# Patient Record
Sex: Female | Born: 1955 | Race: White | Hispanic: No | Marital: Married | Smoking: Never smoker
Health system: Southern US, Community
[De-identification: ages and names within clinical notes are randomized; demographics above are authoritative.]

## PROBLEM LIST (undated history)

## (undated) DIAGNOSIS — Z8742 Personal history of other diseases of the female genital tract: Secondary | ICD-10-CM

## (undated) DIAGNOSIS — N644 Mastodynia: Secondary | ICD-10-CM

## (undated) DIAGNOSIS — M1990 Unspecified osteoarthritis, unspecified site: Secondary | ICD-10-CM

## (undated) DIAGNOSIS — E039 Hypothyroidism, unspecified: Secondary | ICD-10-CM

## (undated) DIAGNOSIS — K5900 Constipation, unspecified: Secondary | ICD-10-CM

## (undated) DIAGNOSIS — I773 Arterial fibromuscular dysplasia: Secondary | ICD-10-CM

## (undated) DIAGNOSIS — T4145XA Adverse effect of unspecified anesthetic, initial encounter: Secondary | ICD-10-CM

## (undated) DIAGNOSIS — I10 Essential (primary) hypertension: Secondary | ICD-10-CM

## (undated) DIAGNOSIS — A159 Respiratory tuberculosis unspecified: Secondary | ICD-10-CM

## (undated) DIAGNOSIS — IMO0001 Reserved for inherently not codable concepts without codable children: Secondary | ICD-10-CM

## (undated) DIAGNOSIS — Z8669 Personal history of other diseases of the nervous system and sense organs: Secondary | ICD-10-CM

## (undated) DIAGNOSIS — G4730 Sleep apnea, unspecified: Secondary | ICD-10-CM

## (undated) DIAGNOSIS — Z8619 Personal history of other infectious and parasitic diseases: Secondary | ICD-10-CM

## (undated) DIAGNOSIS — N926 Irregular menstruation, unspecified: Secondary | ICD-10-CM

## (undated) DIAGNOSIS — I6529 Occlusion and stenosis of unspecified carotid artery: Secondary | ICD-10-CM

## (undated) DIAGNOSIS — E559 Vitamin D deficiency, unspecified: Secondary | ICD-10-CM

## (undated) DIAGNOSIS — K219 Gastro-esophageal reflux disease without esophagitis: Secondary | ICD-10-CM

## (undated) DIAGNOSIS — T8859XA Other complications of anesthesia, initial encounter: Secondary | ICD-10-CM

## (undated) DIAGNOSIS — M8580 Other specified disorders of bone density and structure, unspecified site: Secondary | ICD-10-CM

## (undated) DIAGNOSIS — Z87440 Personal history of urinary (tract) infections: Secondary | ICD-10-CM

## (undated) DIAGNOSIS — E7800 Pure hypercholesterolemia, unspecified: Secondary | ICD-10-CM

## (undated) HISTORY — DX: Other specified disorders of bone density and structure, unspecified site: M85.80

## (undated) HISTORY — DX: Personal history of other infectious and parasitic diseases: Z86.19

## (undated) HISTORY — DX: Occlusion and stenosis of unspecified carotid artery: I65.29

## (undated) HISTORY — DX: Hypothyroidism, unspecified: E03.9

## (undated) HISTORY — DX: Mastodynia: N64.4

## (undated) HISTORY — DX: Irregular menstruation, unspecified: N92.6

## (undated) HISTORY — PX: NASAL SEPTUM SURGERY: SHX37

## (undated) HISTORY — PX: CARDIOVASCULAR STRESS TEST: SHX262

## (undated) HISTORY — PX: BACK SURGERY: SHX140

## (undated) HISTORY — DX: Arterial fibromuscular dysplasia: I77.3

## (undated) HISTORY — DX: Vitamin D deficiency, unspecified: E55.9

## (undated) HISTORY — DX: Personal history of other diseases of the nervous system and sense organs: Z86.69

## (undated) HISTORY — DX: Personal history of urinary (tract) infections: Z87.440

## (undated) HISTORY — DX: Personal history of other diseases of the female genital tract: Z87.42

---

## 1984-06-16 DIAGNOSIS — A159 Respiratory tuberculosis unspecified: Secondary | ICD-10-CM

## 1984-06-16 HISTORY — DX: Respiratory tuberculosis unspecified: A15.9

## 1998-06-12 ENCOUNTER — Other Ambulatory Visit: Admission: RE | Admit: 1998-06-12 | Discharge: 1998-06-12 | Payer: Self-pay | Admitting: *Deleted

## 1999-08-19 ENCOUNTER — Other Ambulatory Visit: Admission: RE | Admit: 1999-08-19 | Discharge: 1999-08-19 | Payer: Self-pay | Admitting: *Deleted

## 1999-10-28 ENCOUNTER — Encounter: Admission: RE | Admit: 1999-10-28 | Discharge: 1999-10-28 | Payer: Self-pay | Admitting: Obstetrics and Gynecology

## 1999-10-28 ENCOUNTER — Encounter: Payer: Self-pay | Admitting: Obstetrics and Gynecology

## 2000-06-15 ENCOUNTER — Other Ambulatory Visit: Admission: RE | Admit: 2000-06-15 | Discharge: 2000-06-15 | Payer: Self-pay | Admitting: Obstetrics and Gynecology

## 2000-06-17 ENCOUNTER — Encounter (INDEPENDENT_AMBULATORY_CARE_PROVIDER_SITE_OTHER): Payer: Self-pay

## 2000-09-23 ENCOUNTER — Encounter: Admission: RE | Admit: 2000-09-23 | Discharge: 2000-09-23 | Payer: Self-pay | Admitting: Obstetrics and Gynecology

## 2000-09-23 ENCOUNTER — Encounter: Payer: Self-pay | Admitting: Obstetrics and Gynecology

## 2000-10-07 ENCOUNTER — Other Ambulatory Visit: Admission: RE | Admit: 2000-10-07 | Discharge: 2000-10-07 | Payer: Self-pay | Admitting: Obstetrics and Gynecology

## 2001-10-27 ENCOUNTER — Other Ambulatory Visit: Admission: RE | Admit: 2001-10-27 | Discharge: 2001-10-27 | Payer: Self-pay | Admitting: Obstetrics and Gynecology

## 2002-04-28 ENCOUNTER — Encounter: Payer: Self-pay | Admitting: Emergency Medicine

## 2002-04-28 ENCOUNTER — Encounter: Admission: RE | Admit: 2002-04-28 | Discharge: 2002-04-28 | Payer: Self-pay | Admitting: Emergency Medicine

## 2002-06-13 ENCOUNTER — Encounter: Payer: Self-pay | Admitting: Otolaryngology

## 2002-06-13 ENCOUNTER — Encounter: Admission: RE | Admit: 2002-06-13 | Discharge: 2002-06-13 | Payer: Self-pay | Admitting: Otolaryngology

## 2002-07-18 ENCOUNTER — Ambulatory Visit (HOSPITAL_COMMUNITY): Admission: RE | Admit: 2002-07-18 | Discharge: 2002-07-18 | Payer: Self-pay | Admitting: Otolaryngology

## 2002-07-18 ENCOUNTER — Encounter: Payer: Self-pay | Admitting: Otolaryngology

## 2002-12-20 ENCOUNTER — Other Ambulatory Visit: Admission: RE | Admit: 2002-12-20 | Discharge: 2002-12-20 | Payer: Self-pay | Admitting: Obstetrics and Gynecology

## 2003-01-10 ENCOUNTER — Ambulatory Visit (HOSPITAL_COMMUNITY): Admission: RE | Admit: 2003-01-10 | Discharge: 2003-01-10 | Payer: Self-pay | Admitting: *Deleted

## 2003-01-17 ENCOUNTER — Ambulatory Visit (HOSPITAL_COMMUNITY): Admission: RE | Admit: 2003-01-17 | Discharge: 2003-01-17 | Payer: Self-pay | Admitting: *Deleted

## 2003-01-17 ENCOUNTER — Encounter: Payer: Self-pay | Admitting: *Deleted

## 2003-05-16 ENCOUNTER — Encounter: Admission: RE | Admit: 2003-05-16 | Discharge: 2003-05-16 | Payer: Self-pay | Admitting: Obstetrics and Gynecology

## 2004-01-25 ENCOUNTER — Other Ambulatory Visit: Admission: RE | Admit: 2004-01-25 | Discharge: 2004-01-25 | Payer: Self-pay | Admitting: Obstetrics and Gynecology

## 2004-01-30 ENCOUNTER — Ambulatory Visit (HOSPITAL_COMMUNITY): Admission: RE | Admit: 2004-01-30 | Discharge: 2004-01-30 | Payer: Self-pay | Admitting: *Deleted

## 2004-05-16 ENCOUNTER — Ambulatory Visit (HOSPITAL_COMMUNITY): Admission: RE | Admit: 2004-05-16 | Discharge: 2004-05-16 | Payer: Self-pay | Admitting: Obstetrics and Gynecology

## 2005-04-07 ENCOUNTER — Other Ambulatory Visit: Admission: RE | Admit: 2005-04-07 | Discharge: 2005-04-07 | Payer: Self-pay | Admitting: Obstetrics and Gynecology

## 2005-09-02 ENCOUNTER — Ambulatory Visit (HOSPITAL_COMMUNITY): Admission: RE | Admit: 2005-09-02 | Discharge: 2005-09-02 | Payer: Self-pay | Admitting: Obstetrics and Gynecology

## 2006-04-14 ENCOUNTER — Other Ambulatory Visit: Admission: RE | Admit: 2006-04-14 | Discharge: 2006-04-14 | Payer: Self-pay | Admitting: Obstetrics and Gynecology

## 2006-09-17 ENCOUNTER — Ambulatory Visit (HOSPITAL_COMMUNITY): Admission: RE | Admit: 2006-09-17 | Discharge: 2006-09-17 | Payer: Self-pay | Admitting: Emergency Medicine

## 2007-09-07 ENCOUNTER — Encounter: Admission: RE | Admit: 2007-09-07 | Discharge: 2007-09-07 | Payer: Self-pay | Admitting: Obstetrics and Gynecology

## 2008-08-28 ENCOUNTER — Encounter: Admission: RE | Admit: 2008-08-28 | Discharge: 2008-08-28 | Payer: Self-pay | Admitting: Obstetrics and Gynecology

## 2009-08-30 ENCOUNTER — Encounter: Admission: RE | Admit: 2009-08-30 | Discharge: 2009-08-30 | Payer: Self-pay | Admitting: Obstetrics and Gynecology

## 2009-10-12 ENCOUNTER — Ambulatory Visit: Payer: Self-pay | Admitting: Vascular Surgery

## 2010-07-06 ENCOUNTER — Encounter: Payer: Self-pay | Admitting: Obstetrics and Gynecology

## 2010-07-07 ENCOUNTER — Encounter: Payer: Self-pay | Admitting: *Deleted

## 2010-10-08 ENCOUNTER — Other Ambulatory Visit (INDEPENDENT_AMBULATORY_CARE_PROVIDER_SITE_OTHER): Payer: PRIVATE HEALTH INSURANCE

## 2010-10-08 DIAGNOSIS — I6529 Occlusion and stenosis of unspecified carotid artery: Secondary | ICD-10-CM

## 2010-10-10 NOTE — Procedures (Unsigned)
CAROTID DUPLEX EXAM  INDICATION:  Carotid artery disease.  HISTORY: Diabetes:  No. Cardiac:  No. Hypertension:  Yes. Smoking:  No. Previous Surgery:  No. CV History:  No. Amaurosis Fugax No, Paresthesias No, Hemiparesis No.                                      RIGHT             LEFT Brachial systolic pressure:         132               127 Brachial Doppler waveforms:         Normal            Normal Vertebral direction of flow:        Antegrade         Antegrade DUPLEX VELOCITIES (cm/sec) CCA peak systolic                   64                71 ECA peak systolic                   62                39 ICA peak systolic                   M = 48, D = 120   M = 61, D = 116 ICA end diastolic                   M = 28, D = 60    M = 27, D = 50 PLAQUE MORPHOLOGY: PLAQUE AMOUNT:                      None              None PLAQUE LOCATION:  IMPRESSION: 1. Bilaterally elevated velocities are noted in the distal internal     carotid arteries. 2. This is most likely due to vessel tortuosity. 3. No plaque visualized in either internal carotid artery. 4. Antegrade vertebral arteries bilaterally.  ___________________________________________ Larina Earthly, M.D.  EM/MEDQ  D:  10/08/2010  T:  10/08/2010  Job:  [id number]

## 2010-10-16 ENCOUNTER — Other Ambulatory Visit: Payer: Self-pay

## 2010-10-29 NOTE — Assessment & Plan Note (Signed)
OFFICE VISIT   Judith Mcdonald, Judith Mcdonald  DOB:  [birth year]                                       10/12/2009  UKGUR#:[id number]   Patient presents today for followup of her known carotid and vertebral  fibromuscular dysplasia.  She was initially evaluated by Dr. Liliane Bade  in 2004 when she had studies due to some pulsatile tinnitus.  She has  had multiple MRIs since that time.  Also no progression of her disease.  That evaluation was in 2008.  She denies any focal neurologic deficits,  specifically amaurosis fugax, transient ischemic attack, or stroke.  She  occasionally does have some pounding sensation, but this is uncommon.  She has mild hypertension controlled with hydrochlorothiazide.   PAST MEDICAL HISTORY:  Otherwise unremarkable.  She is not a diabetic.  No cardiac disease.   FAMILY HISTORY:  Complete unremarkable.   SOCIAL HISTORY:  She is married with 2 children.  She works in  occupational therapy.  She does not smoke or drink alcohol.   REVIEW OF SYSTEMS:  No weight loss or weight gain.  Weight is 150  pounds.  She is 5 feet 5 inches tall.  Does have a history of esophageal  reflux, joint pain.  Review of systems is documented in her chart and  otherwise unremarkable.   PHYSICAL EXAMINATION:  A well-developed, well-nourished white female  appearing stated age.  Blood pressure is 147/91, right arm, 130/80, left  arm.  Heart rate is 55.  Temperature is 97.9.  She is in no acute  stress.  HEENT is normal.  Carotid arteries without bruits bilaterally.  Chest is clear bilaterally.  She has 2+ radial pulses bilaterally.   She underwent a carotid duplex in our office today and this reveals  moderate 49% stenosis bilaterally.  On the basis of velocities, she does  not have any plaque visualized.   I discussed this at length with patient.  I explained the symptoms of  carotid disease, which she should alert [place] to, otherwise we plan to see  her again  yearly for carotid duplex follow-up.     Larina Earthly, M.D.  Electronically Signed   TFE/MEDQ  D:  10/12/2009  T:  10/15/2009  Job:  [id number]   cc:   Kari Baars, M.D.

## 2010-10-29 NOTE — Procedures (Signed)
CAROTID DUPLEX EXAM   INDICATION:  Followup FMD.   HISTORY:  Diabetes:  No.  Cardiac:  No.  Hypertension:  Yes.  Smoking:  No.  Previous Surgery:  No.  CV History:  No.  Amaurosis Fugax No, Paresthesias No, Hemiparesis No                                       RIGHT             LEFT  Brachial systolic pressure:         130               120  Brachial Doppler waveforms:         Biphasic          Biphasic  Vertebral direction of flow:        Antegrade         Antegrade  DUPLEX VELOCITIES (cm/sec)  CCA peak systolic                   54                60  ECA peak systolic                   78                68  ICA peak systolic                   188 (distal)      120 (mid to  distal)  ICA end diastolic                   81                48  PLAQUE MORPHOLOGY:                  None              None  PLAQUE AMOUNT:                      None              None  PLAQUE LOCATION:                    None              None   IMPRESSION:  1. 40%-59% stenosis without plaque noted in bilateral mid to distal      internal carotid arteries, questionable FMD (fibromuscular      dysplasia).  2. Antegrade bilateral vertebral arteries.   ___________________________________________  Larina Earthly, M.D.   MG/MEDQ  D:  10/12/2009  T:  10/12/2009  Job:  [id number]

## 2010-11-01 NOTE — Assessment & Plan Note (Signed)
OFFICE VISIT   Judith Mcdonald, Judith Mcdonald  DOB:  1956/05/29                                       02/04/2007  ZOXWR#:[id number]   The patient underwent followup MRI angiography of the neck for bilateral  carotid fibromuscular dysplasia.  This was carried out at [facility].  Limited examination, there was no gross interval change in comparison to  previous study 1 year earlier.   I discussed these results with the patient by phone today.  She has had  some mild hypertension.  Apparently did have a renal ultrasound  recently.  She is going to obtain the results of this and have them sent  to my office.   Balinda Quails, M.D.  Electronically Signed   PGH/MEDQ  D:  02/04/2007  T:  02/06/2007  Job:  [id number]

## 2011-03-06 ENCOUNTER — Other Ambulatory Visit: Payer: Self-pay | Admitting: Obstetrics and Gynecology

## 2011-03-06 DIAGNOSIS — Z1231 Encounter for screening mammogram for malignant neoplasm of breast: Secondary | ICD-10-CM

## 2011-03-25 ENCOUNTER — Ambulatory Visit
Admission: RE | Admit: 2011-03-25 | Discharge: 2011-03-25 | Disposition: A | Discharge status: Home / Self Care | Payer: PRIVATE HEALTH INSURANCE | Source: Ambulatory Visit | Attending: Obstetrics and Gynecology | Admitting: Obstetrics and Gynecology

## 2011-03-25 DIAGNOSIS — Z1231 Encounter for screening mammogram for malignant neoplasm of breast: Secondary | ICD-10-CM

## 2011-06-17 DIAGNOSIS — IMO0001 Reserved for inherently not codable concepts without codable children: Secondary | ICD-10-CM

## 2011-06-17 HISTORY — DX: Reserved for inherently not codable concepts without codable children: IMO0001

## 2011-06-17 HISTORY — PX: CARDIAC CATHETERIZATION: SHX172

## 2011-09-23 ENCOUNTER — Ambulatory Visit: Payer: Self-pay | Admitting: Obstetrics and Gynecology

## 2011-09-25 ENCOUNTER — Encounter (HOSPITAL_COMMUNITY): Payer: Self-pay | Admitting: Nurse Practitioner

## 2011-09-25 ENCOUNTER — Emergency Department (HOSPITAL_COMMUNITY)
Admission: EM | Admit: 2011-09-25 | Discharge: 2011-09-25 | Disposition: A | Discharge status: Home / Self Care | Payer: PRIVATE HEALTH INSURANCE | Attending: Emergency Medicine | Admitting: Emergency Medicine

## 2011-09-25 ENCOUNTER — Emergency Department (HOSPITAL_COMMUNITY): Payer: PRIVATE HEALTH INSURANCE

## 2011-09-25 DIAGNOSIS — I10 Essential (primary) hypertension: Secondary | ICD-10-CM | POA: Insufficient documentation

## 2011-09-25 DIAGNOSIS — E785 Hyperlipidemia, unspecified: Secondary | ICD-10-CM | POA: Insufficient documentation

## 2011-09-25 DIAGNOSIS — R6884 Jaw pain: Secondary | ICD-10-CM | POA: Insufficient documentation

## 2011-09-25 DIAGNOSIS — Z7982 Long term (current) use of aspirin: Secondary | ICD-10-CM | POA: Insufficient documentation

## 2011-09-25 DIAGNOSIS — R079 Chest pain, unspecified: Secondary | ICD-10-CM | POA: Insufficient documentation

## 2011-09-25 HISTORY — DX: Essential (primary) hypertension: I10

## 2011-09-25 HISTORY — DX: Pure hypercholesterolemia, unspecified: E78.00

## 2011-09-25 LAB — COMPREHENSIVE METABOLIC PANEL
ALT: 27 U/L (ref 0–35)
AST: 24 U/L (ref 0–37)
CO2: 28 mEq/L (ref 19–32)
Chloride: 100 mEq/L (ref 96–112)
Creatinine, Ser: 0.8 mg/dL (ref 0.50–1.10)
GFR calc non Af Amer: 80 mL/min — ABNORMAL LOW (ref >90)
Sodium: 137 mEq/L (ref 135–145)
Total Bilirubin: 0.3 mg/dL (ref 0.3–1.2)

## 2011-09-25 LAB — CBC
MCV: 89.6 fL (ref 78.0–100.0)
Platelets: 276 10*3/uL (ref 150–400)
RBC: 4.63 MIL/uL (ref 3.87–5.11)
WBC: 9.7 10*3/uL (ref 4.0–10.5)

## 2011-09-25 LAB — URINALYSIS, ROUTINE W REFLEX MICROSCOPIC
Bilirubin Urine: NEGATIVE
Ketones, ur: NEGATIVE mg/dL
Protein, ur: NEGATIVE mg/dL
Urobilinogen, UA: 0.2 mg/dL (ref 0.0–1.0)

## 2011-09-25 LAB — URINE MICROSCOPIC-ADD ON

## 2011-09-25 LAB — POCT I-STAT TROPONIN I: Troponin i, poc: 0 ng/mL (ref 0.00–0.08)

## 2011-09-25 MED ORDER — NITROGLYCERIN 0.4 MG SL SUBL
0.4000 mg | SUBLINGUAL_TABLET | SUBLINGUAL | Status: DC | PRN
  Filled 2011-09-25: qty 25

## 2011-09-25 MED ORDER — GI COCKTAIL ~~LOC~~
30.0000 mL | Freq: Once | ORAL | Status: AC
  Administered 2011-09-25: 30 mL via ORAL
  Filled 2011-09-25: qty 30

## 2011-09-25 MED ORDER — SUCRALFATE 1 G PO TABS
1.0000 g | ORAL_TABLET | ORAL | Status: DC
  Filled 2011-09-25: qty 1

## 2011-09-25 MED ORDER — ASPIRIN 81 MG PO CHEW
324.0000 mg | CHEWABLE_TABLET | Freq: Once | ORAL | Status: AC
  Administered 2011-09-25: 162 mg via ORAL
  Filled 2011-09-25: qty 2

## 2011-09-25 MED ORDER — SUCRALFATE 1 G PO TABS: 1.0000 g | ORAL_TABLET | Freq: Four times a day (QID) | ORAL | Status: DC

## 2011-09-25 NOTE — ED Notes (Signed)
Patient states past 10 days under stress in laws are in town.  History of back fracture and pain between shoulder blades.  Today developed pain in her neck radiating to left and right chest then back to her neck. 4/10 diffuse. Airway intact bilateral equal chest rise and fall.  Patient resting comfortably on stretcher reading a book.

## 2011-09-25 NOTE — ED Provider Notes (Signed)
History     CSN: [id number]  Arrival date & time 09/25/11  1017   First MD Initiated Contact with Patient 09/25/11 1027      Chief Complaint  Patient presents with  . Chest Pain    (Consider location/radiation/quality/duration/timing/severity/associated sxs/prior treatment) HPI Comments: Patient with a history of hypertension and hyperlipidemia presents emergency department with a chief complaint of jaw and chest pain.  Symptom onset was at 9AM this morning location of pain originated in the jaw and radiates down to chest and across chest bilaterally and 2 the pack.  Pain is described as bowl and diffuse and rated at a 4/10 in severity. CP is reproducible.  Patient denies having a cardiac history as well as associated symptoms including shortness of breath, diaphoresis, PND, orthopnea, leg swelling, claudication, cough, fevers, night sweats, chills, abdominal pain, nausea, vomiting, change in bowel movements.  Patient states she had a yearly physical at Dr. Alver Fisher office yesterday and was found to be hypertensive.  Patient was advised at that time to double her blood pressure medication which she has.  Patient states she had a stress test by her cardiologist Dr. Arlyn Leak 3 years ago with normal results.  No significant family history including early MI, early sudden death, or aneurysm.  The history is provided by the patient.    Past Medical History  Diagnosis Date  . H/O typhus     at age 77 yrs  . Hx: UTI (urinary tract infection)   . Hx of menorrhagia   . Irregular menses   . Hypothyroidism   . Hx of migraines     with menses  . Hx of amenorrhea   . History of recurrent UTIs   . Mastodynia   . Hypercholesteremia   . Hypertension     Past Surgical History  Procedure Date  . Cesarean section     2x in 1984 & 1986    Family History  Problem Relation Age of Onset  . Hypertension Father   . Cancer Father   . Heart disease Maternal Grandmother     History  Substance Use  Topics  . Smoking status: Never Smoker   . Smokeless tobacco: Not on file  . Alcohol Use: Yes     social    OB History    Grav Para Term Preterm Abortions TAB SAB Ect Mult Living                  Review of Systems  Constitutional: Negative for fever, chills, diaphoresis and appetite change.  HENT: Negative for congestion, neck pain and neck stiffness.   Eyes: Negative for visual disturbance.  Respiratory: Negative for cough, chest tightness, shortness of breath and wheezing.   Cardiovascular: Positive for chest pain. Negative for leg swelling.  Gastrointestinal: Negative for abdominal pain.  Genitourinary: Negative for dysuria, urgency and frequency.  Neurological: Negative for dizziness, syncope, weakness, light-headedness, numbness and headaches.  Psychiatric/Behavioral: Negative for confusion.  All other systems reviewed and are negative.    Allergies  Review of patient's allergies indicates no known allergies.  Home Medications   Current Outpatient Rx  Name Route Sig Dispense Refill  . ASPIRIN EC 81 MG PO TBEC Oral Take 81 mg by mouth daily.    Marland Kitchen VITAMIN D 2000 UNITS PO CAPS Oral Take 1 capsule by mouth daily.    Marland Kitchen FLUTICASONE PROPIONATE 50 MCG/ACT NA SUSP Nasal Place 1-2 sprays into the nose daily.    Marland Kitchen HYDROCHLOROTHIAZIDE 25 MG PO TABS  Oral Take 25 mg by mouth daily.    Marland Kitchen LEVOTHYROXINE SODIUM 75 MCG PO TABS Oral Take 75 mcg by mouth daily.    Marland Kitchen LISINOPRIL 10 MG PO TABS Oral Take 10 mg by mouth daily.    Marland Kitchen RABEPRAZOLE SODIUM 20 MG PO TBEC Oral Take 20 mg by mouth daily.    Marland Kitchen ROSUVASTATIN CALCIUM 10 MG PO TABS Oral Take 10 mg by mouth daily.      BP 153/107  Pulse 78  Temp(Src) 97.9 F (36.6 C) (Oral)  Resp 18  Ht 5\' 5"  (1.651 m)  Wt 150 lb (68.04 kg)  BMI 24.96 kg/m2  SpO2 96%  Physical Exam  Nursing note and vitals reviewed. Constitutional: She appears well-developed and well-nourished. No distress.  HENT:  Head: Normocephalic and atraumatic.        Normal jaw ROM, no ttp of TMJ   Eyes: Conjunctivae and EOM are normal. Pupils are equal, round, and reactive to light.  Neck: Normal range of motion. Neck supple. Normal carotid pulses and no JVD present. Carotid bruit is not present. No rigidity. Normal range of motion present.  Cardiovascular: Normal rate, regular rhythm, S1 normal, S2 normal, normal heart sounds, intact distal pulses and normal pulses.  Exam reveals no gallop and no friction rub.   No murmur heard.      No pitting edema bilaterally, RRR, no aberrant sounds on auscultations, distal pulses intact, no carotid bruit or JVD.   Pulmonary/Chest: Effort normal and breath sounds normal. No accessory muscle usage or stridor. No respiratory distress. She exhibits no tenderness and no bony tenderness.  Abdominal: Bowel sounds are normal.       Soft non tender. Non pulsatile aorta.   Skin: Skin is warm, dry and intact. No rash noted. She is not diaphoretic. No cyanosis. Nails show no clubbing.    ED Course  Procedures (including critical care time)   Labs Reviewed  CBC  COMPREHENSIVE METABOLIC PANEL  URINALYSIS, ROUTINE W REFLEX MICROSCOPIC   No results found.   No diagnosis found.    Date: 09/25/2011  Rate:  86  Rhythm: normal sinus rhythm  QRS Axis: normal  Intervals: normal  ST/T Wave abnormalities: normal  Conduction Disutrbances: none  Narrative Interpretation:   Old EKG Reviewed: No significant changes noted      MDM  Chest pain, hypertension  Pt with increased stress presents with CP and HTN. Low concern for ACS. Move to CDU for 2nd round troponin. If WNL, pldc is to dc w follow up w Dr. Arlyn Leak early next week. Pt is stable without active CP prior to trasfer. Discussed case with nurse and PA in CDU.           Jaci Carrel, [place] 09/27/11 1620

## 2011-09-25 NOTE — ED Notes (Signed)
EDP at bedside speaking with patient and husband.  States not to give nitro at this time.

## 2011-09-25 NOTE — ED Provider Notes (Signed)
Pt in CDU, waiting on 3hr troponin.  She presented to ED w/ atypical chest pain that feels similar to her acid reflux and seemed to improve w/ GI cocktail.  Has been evaluated by Dr. Donnie Aho in the past and reports an unremarkable stress echo 2-4 years ago.  Has had mild chest tightness at rest here in ED.  On repeat exam, pt in NAD, VSS, heart w/ RRR and lungs CTA.  Will try a dose of carafate and d/c pt home w/ same if repeat troponin neg.  Pt takes daily aciphex currently and I recommended she continue this medication.    Judith Mcdonald [place], [place] 09/25/11 2016

## 2011-09-25 NOTE — ED Notes (Signed)
Reports she has been very stressed out over past 10 days. Reports she went for a routine physical yesterday and BP was elevated. Woke this am with chest pain radiating into jaw and back. Took tums with no relief. Pain has persisted since onset. Denies cardiac history

## 2011-09-25 NOTE — ED Notes (Signed)
Pt states this is probably r/t on going stress in family. States in-laws are visiting this week.

## 2011-09-25 NOTE — ED Notes (Signed)
Diet tray ordered 

## 2011-09-25 NOTE — Discharge Instructions (Signed)
Take carafate up to four times a day as needed for acid reflux pain.  Continue your aciphex.  Follow up with Dr. Donnie Aho sometime within the next week.  You should return to the ER if your symptoms worsen or you have any other concerns. Chest Pain (Nonspecific) It is often hard to give a specific diagnosis for the cause of chest pain. There is always a chance that your pain could be related to something serious, such as a heart attack or a blood clot in the lungs. You need to follow up with your caregiver for further evaluation. CAUSES   Heartburn.   Pneumonia or bronchitis.   Anxiety or stress.   Inflammation around your heart (pericarditis) or lung (pleuritis or pleurisy).   A blood clot in the lung.   A collapsed lung (pneumothorax). It can develop suddenly on its own (spontaneous pneumothorax) or from injury (trauma) to the chest.   Shingles infection (herpes zoster virus).  The chest wall is composed of bones, muscles, and cartilage. Any of these can be the source of the pain.  The bones can be bruised by injury.   The muscles or cartilage can be strained by coughing or overwork.   The cartilage can be affected by inflammation and become sore (costochondritis).  DIAGNOSIS  Lab tests or other studies, such as X-rays, electrocardiography, stress testing, or cardiac imaging, may be needed to find the cause of your pain.  TREATMENT   Treatment depends on what may be causing your chest pain. Treatment may include:   Acid blockers for heartburn.   Anti-inflammatory medicine.   Pain medicine for inflammatory conditions.   Antibiotics if an infection is present.   You may be advised to change lifestyle habits. This includes stopping smoking and avoiding alcohol, caffeine, and chocolate.   You may be advised to keep your head raised (elevated) when sleeping. This reduces the chance of acid going backward from your stomach into your esophagus.   Most of the time, nonspecific chest  pain will improve within 2 to 3 days with rest and mild pain medicine.  HOME CARE INSTRUCTIONS   If antibiotics were prescribed, take your antibiotics as directed. Finish them even if you start to feel better.   For the next few days, avoid physical activities that bring on chest pain. Continue physical activities as directed.   Do not smoke.   Avoid drinking alcohol.   Only take over-the-counter or prescription medicine for pain, discomfort, or fever as directed by your caregiver.   Follow your caregiver's suggestions for further testing if your chest pain does not go away.   Keep any follow-up appointments you made. If you do not go to an appointment, you could develop lasting (chronic) problems with pain. If there is any problem keeping an appointment, you must call to reschedule.  SEEK MEDICAL CARE IF:   You think you are having problems from the medicine you are taking. Read your medicine instructions carefully.   Your chest pain does not go away, even after treatment.   You develop a rash with blisters on your chest.  SEEK IMMEDIATE MEDICAL CARE IF:   You have increased chest pain or pain that spreads to your arm, neck, jaw, back, or abdomen.   You develop shortness of breath, an increasing cough, or you are coughing up blood.   You have severe back or abdominal pain, feel nauseous, or vomit.   You develop severe weakness, fainting, or chills.   You have a  fever.  THIS IS AN EMERGENCY. Do not wait to see if the pain will go away. Get medical help at once. Call your local emergency services (911 in [place] Do not drive yourself to the hospital. MAKE SURE YOU:   Understand these instructions.   Will watch your condition.   Will get help right away if you are not doing well or get worse.  Document Released: 03/12/2005 Document Revised: 05/22/2011 Document Reviewed: 01/06/2008 [facility] Patient Information 2012 [place].

## 2011-09-26 NOTE — ED Provider Notes (Signed)
Medical screening examination/treatment/procedure(s) were conducted as a shared visit with non-physician practitioner(s) and myself.  I personally evaluated the patient during the encounter   Glynn Octave, MD 09/26/11 [phone]

## 2011-09-28 NOTE — ED Provider Notes (Signed)
Medical screening examination/treatment/procedure(s) were conducted as a shared visit with non-physician practitioner(s) and myself.  I personally evaluated the patient during the encounter  Atypical chest pain x 3hours, reproducible. EKG normal sinus.   Glynn Octave, MD 09/28/11 [phone]

## 2011-10-15 ENCOUNTER — Other Ambulatory Visit: Payer: Self-pay | Admitting: Obstetrics and Gynecology

## 2011-10-15 NOTE — Telephone Encounter (Signed)
Routed to nurse pool 

## 2011-10-15 NOTE — Telephone Encounter (Signed)
[facility] PT 

## 2011-10-16 ENCOUNTER — Other Ambulatory Visit: Payer: PRIVATE HEALTH INSURANCE

## 2011-10-16 ENCOUNTER — Ambulatory Visit: Payer: PRIVATE HEALTH INSURANCE | Admitting: Neurosurgery

## 2011-10-16 NOTE — Telephone Encounter (Signed)
Lm on vm to cb °

## 2011-10-20 ENCOUNTER — Ambulatory Visit: Payer: Self-pay | Admitting: Obstetrics and Gynecology

## 2011-10-21 ENCOUNTER — Other Ambulatory Visit: Payer: Self-pay

## 2011-10-21 MED ORDER — ESTRADIOL 10 MCG VA TABS: 1.0000 | ORAL_TABLET | VAGINAL | Status: DC

## 2011-10-21 NOTE — Telephone Encounter (Signed)
Written rx received from pharm for Vagifem. AEX sched 11-12-11 with [facility]. Rx rf faxed to [facility] [phone] til AEX.

## 2011-11-05 ENCOUNTER — Encounter: Payer: Self-pay | Admitting: Neurosurgery

## 2011-11-06 ENCOUNTER — Ambulatory Visit (INDEPENDENT_AMBULATORY_CARE_PROVIDER_SITE_OTHER): Payer: PRIVATE HEALTH INSURANCE | Admitting: *Deleted

## 2011-11-06 ENCOUNTER — Encounter: Payer: Self-pay | Admitting: Neurosurgery

## 2011-11-06 ENCOUNTER — Ambulatory Visit (INDEPENDENT_AMBULATORY_CARE_PROVIDER_SITE_OTHER): Payer: PRIVATE HEALTH INSURANCE | Admitting: Neurosurgery

## 2011-11-06 VITALS — BP 107/74 | HR 58 | Resp 12 | Ht 65.0 in | Wt 144.7 lb

## 2011-11-06 DIAGNOSIS — I6529 Occlusion and stenosis of unspecified carotid artery: Secondary | ICD-10-CM

## 2011-11-06 NOTE — Progress Notes (Signed)
[facility] [place] HISTORY AND PHYSICAL   CC: Yearly carotid duplex for known vertebral fibromuscular dysplasia Referring Physician: Early  History of Present Illness: 56 year-old female patient of Dr. Arbie Cookey is followed for known vertebral fibromuscular dysplasia. The patient reports no signs or symptoms of CVA or TIA. She has no headaches and reports no new medical diagnoses or recent surgeries.  Past Medical History  Diagnosis Date  . H/O typhus     at age 61 yrs  . Hx: UTI (urinary tract infection)   . Hx of menorrhagia   . Irregular menses   . Hypothyroidism   . Hx of migraines     with menses  . Hx of amenorrhea   . History of recurrent UTIs   . Mastodynia   . Hypercholesteremia   . Hypertension     ROS: [x]  Positive   [ ]  Denies    General: [ ]  Weight loss, [ ]  Fever, [ ]  chills Neurologic: [ ]  Dizziness, [ ]  Blackouts, [ ]  Seizure [ ]  Stroke, [ ]  "Mini stroke", [ ]  Slurred speech, [ ]  Temporary blindness; [ ]  weakness in arms or legs, [ ]  Hoarseness Cardiac: [ ]  Chest pain/pressure, [ ]  Shortness of breath at rest [ ]  Shortness of breath with exertion, [ ]  Atrial fibrillation or irregular heartbeat Vascular: [ ]  Pain in legs with walking, [ ]  Pain in legs at rest, [ ]  Pain in legs at night,  [ ]  Non-healing ulcer, [ ]  Blood clot in vein/DVT,   Pulmonary: [ ]  Home oxygen, [ ]  Productive cough, [ ]  Coughing up blood, [ ]  Asthma,  [ ]  Wheezing Musculoskeletal:  [ ]  Arthritis, [ ]  Low back pain, [ ]  Joint pain Hematologic: [ ]  Easy Bruising, [ ]  Anemia; [ ]  Hepatitis Gastrointestinal: [ ]  Blood in stool, [ ]  Gastroesophageal Reflux/heartburn, [ ]  Trouble swallowing Urinary: [ ]  chronic Kidney disease, [ ]  on HD - [ ]  MWF or [ ]  TTHS, [ ]  Burning with urination, [ ]  Difficulty urinating Skin: [ ]  Rashes, [ ]  Wounds Psychological: [ ]  Anxiety, [ ]  Depression   Social History History  Substance Use Topics  . Smoking status: Never Smoker   .  Smokeless tobacco: Not on file  . Alcohol Use: Yes     social    Family History Family History  Problem Relation Age of Onset  . Hypertension Father   . Cancer Father   . Heart disease Maternal Grandmother     No Known Allergies  Current Outpatient Prescriptions  Medication Sig Dispense Refill  . aspirin EC 81 MG tablet Take 81 mg by mouth daily.      . Cholecalciferol (VITAMIN D) 2000 UNITS CAPS Take 1 capsule by mouth daily.      . Estradiol (VAGIFEM) 10 MCG TABS Place 1 tablet (10 mcg total) vaginally 2 (two) times a week.  24 tablet  0  . fluticasone (FLONASE) 50 MCG/ACT nasal spray Place 1-2 sprays into the nose daily.      . hydrochlorothiazide (HYDRODIURIL) 25 MG tablet Take 25 mg by mouth daily.      Marland Kitchen levothyroxine (SYNTHROID, LEVOTHROID) 75 MCG tablet Take 75 mcg by mouth daily.      Marland Kitchen lisinopril (PRINIVIL,ZESTRIL) 10 MG tablet Take 10 mg by mouth daily.      . rosuvastatin (CRESTOR) 10 MG tablet Take 10 mg by mouth daily.      . RABEprazole (ACIPHEX) 20 MG tablet Take  20 mg by mouth daily.      . sucralfate (CARAFATE) 1 G tablet Take 1 tablet (1 g total) by mouth 4 (four) times daily.  30 tablet  0    Physical Examination  Filed Vitals:   11/06/11 1127  BP: 107/74  Pulse: 58  Resp: 12    Body mass index is 24.08 kg/(m^2).  General:  WDWN in NAD Gait: Normal HEENT: WNL Eyes: Pupils equal Pulmonary: normal non-labored breathing , without Rales, rhonchi,  wheezing Cardiac: RRR, without  Murmurs, rubs or gallops; Abdomen: soft, NT, no masses Skin: no rashes, ulcers noted  Vascular Exam Pulses: 2+ radial pulses bilaterally Carotid bruits: Carotid pulses to auscultation with no bruits heard Extremities without ischemic changes, no Gangrene , no cellulitis; no open wounds;  Musculoskeletal: no muscle wasting or atrophy   Neurologic: A&O X 3; Appropriate Affect ; SENSATION: normal; MOTOR FUNCTION:  moving all extremities equally. Speech is  fluent/normal  Non-Invasive Vascular Imaging CAROTID DUPLEX 11/06/2011  Right ICA 0 - 19% stenosis Left ICA 0 - 19% stenosis   ASSESSMENT/PLAN: asymptomatic patient with known vertebral fibromuscular dysplasia, the patient will followup here in one year for repeat carotid duplex, her questions were encouraged and answered she is in agreement with this plan.  Lauree Chandler ANP   Clinic MD: Darrick Penna

## 2011-11-06 NOTE — Progress Notes (Signed)
Addended by: Sharee Pimple on: 11/06/2011 01:28 PM   Modules accepted: Orders

## 2011-11-12 ENCOUNTER — Encounter: Payer: Self-pay | Admitting: Obstetrics and Gynecology

## 2011-11-12 ENCOUNTER — Ambulatory Visit (INDEPENDENT_AMBULATORY_CARE_PROVIDER_SITE_OTHER): Payer: PRIVATE HEALTH INSURANCE | Admitting: Obstetrics and Gynecology

## 2011-11-12 ENCOUNTER — Other Ambulatory Visit: Payer: Self-pay | Admitting: Obstetrics and Gynecology

## 2011-11-12 DIAGNOSIS — N952 Postmenopausal atrophic vaginitis: Secondary | ICD-10-CM | POA: Insufficient documentation

## 2011-11-12 DIAGNOSIS — Z124 Encounter for screening for malignant neoplasm of cervix: Secondary | ICD-10-CM

## 2011-11-12 DIAGNOSIS — R319 Hematuria, unspecified: Secondary | ICD-10-CM | POA: Insufficient documentation

## 2011-11-12 DIAGNOSIS — I6529 Occlusion and stenosis of unspecified carotid artery: Secondary | ICD-10-CM

## 2011-11-12 LAB — POCT URINALYSIS DIPSTICK
Protein, UA: NEGATIVE
Spec Grav, UA: 1.01
Urobilinogen, UA: NEGATIVE

## 2011-11-12 MED ORDER — NITROFURANTOIN MACROCRYSTAL 50 MG PO CAPS: 50.0000 mg | ORAL_CAPSULE | ORAL | Status: DC | PRN

## 2011-11-12 MED ORDER — ESTRADIOL 10 MCG VA TABS: 1.0000 | ORAL_TABLET | VAGINAL | Status: DC

## 2011-11-12 NOTE — Progress Notes (Signed)
Addended by: Darien Ramus on: 11/12/2011 11:00 AM   Modules accepted: Orders

## 2011-11-12 NOTE — Patient Instructions (Signed)

## 2011-11-12 NOTE — Assessment & Plan Note (Signed)
dx'd 2003 no further sx

## 2011-11-12 NOTE — Progress Notes (Signed)
The patient is not  taking hormone replacement therapy, but uses Vagifem about once every 2 weeks for vaginal dryness The patient  is not currently taking a Calcium supplement. Post-menopausal bleeding:no  Last Pap: approximate date 08/2010 and was normal No hx of abnormal Last mammogram: approximate date 03/2011 and was normal Last DEXA scan : 08/2011 Last colonoscopy:approximate date 2005 and was normal   Urinary symptoms: hematuria, burning Normal bowel movements: Yes Reports abuse at home: No:   Subjective:    Judith Mcdonald is a 56 y.o. female, [phone], who presents for an annual exam.     History   Social History  . Marital Status: Married    Spouse Name: N/A    Number of Children: N/A  . Years of Education: N/A   Social History Main Topics  . Smoking status: Never Smoker   . Smokeless tobacco: Never Used  . Alcohol Use: Yes     social  . Drug Use: No  . Sexually Active: Yes    Birth Control/ Protection: None   Other Topics Concern  . None   Social History Narrative  . None    Menstrual cycle:   LMP: No LMP recorded. Patient is postmenopausal.        The following portions of the patient's history were reviewed and updated as appropriate: allergies, current medications, past family history, past medical history, past social history, past surgical history and problem list. The patient has been referred to urologist for re-evaluation for hematuria.  She uses prophylactic Macrodantin for intercourse.  Review of Systems Pertinent items are noted in HPI. Breast:Negative for breast lump,nipple discharge or nipple retraction Gastrointestinal: Negative for abdominal pain, change in bowel habits or rectal bleeding Urinary:as above   Objective:    BP 122/58  Ht 5' 4.5" (1.638 m)  Wt 146 lb (66.225 kg)  BMI 24.67 kg/m2    Weight:  Wt Readings from Last 1 Encounters:  11/12/11 146 lb (66.225 kg)          BMI: Body mass index is 24.67 kg/(m^2).  General  Appearance: Alert, appropriate appearance for age. No acute distress HEENT: Grossly normal Neck / Thyroid: Supple, no masses, nodes or enlargement Lungs: clear to auscultation bilaterally Back: No CVA tenderness Breast Exam: No masses or nodes.No dimpling, nipple retraction or discharge. Cardiovascular: Regular rate and rhythm. S1, S2, no murmur Gastrointestinal: Soft, non-tender, no masses or organomegaly Pelvic Exam: Vulva and vagina appear normal. Bimanual exam reveals normal uterus and adnexa. Rectovaginal: normal rectal, no masses Lymphatic Exam: Non-palpable nodes in neck, clavicular, axillary, or inguinal regions Skin: no rash or abnormalities Neurologic: Normal gait and speech, no tremor  Psychiatric: Alert and oriented, appropriate affect.   Wet Prep:not applicable Urinalysis:leukocytes C&S  Sent UPT: Not done   Assessment:    Normal gyn exam Menopause with atrophic vaginal sx relieved by prn Vagifem UTI's prophylaxis, but chronic hematuria    Plan:    mammogram pap smear with HR PV return annually or prn CONTINUE MACRODANTIN AND VAGIFEM uROLOGY F/U AS ORDERED          Brion Sossamon PMD

## 2011-11-12 NOTE — Telephone Encounter (Signed)
Pc to pharm per telephone call. Quantity #30 for Macrodantin verified. Pharm agrees.

## 2011-11-12 NOTE — Telephone Encounter (Signed)
Judith Mcdonald/vph pt/pt was seen today

## 2011-11-12 NOTE — Procedures (Unsigned)
CAROTID DUPLEX EXAM  INDICATION:  Follow up carotid disease/fibromuscular dysplasia.  HISTORY: Diabetes:  No. Cardiac:  No. Hypertension:  Yes. Smoking:  No. Previous Surgery:  No. CV History:  No. Amaurosis Fugax No, Paresthesias No, Hemiparesis No                                      RIGHT             LEFT Brachial systolic pressure:         130               122 Brachial Doppler waveforms:         Normal            Normal Vertebral direction of flow:        Antegrade         Antegrade DUPLEX VELOCITIES (cm/sec) CCA peak systolic                   64                66 ECA peak systolic                   48                56 ICA peak systolic                   96                64 ICA end diastolic                   51                30 PLAQUE MORPHOLOGY: PLAQUE AMOUNT:                      None              None PLAQUE LOCATION:  IMPRESSION: 1. Elevated velocities in the distal bilateral internal carotid     arteries which is most likely due to vessel tortuosity because no     plaque is visualized. 2. Antegrade vertebral arteries bilaterally. 3. Stable in comparison to the last exam.  ___________________________________________ Larina Earthly, M.D.  EM/MEDQ  D:  11/07/2011  T:  11/07/2011  Job:  [id number]

## 2011-11-14 LAB — URINE CULTURE

## 2011-11-16 LAB — PAP IG AND HPV HIGH-RISK: HPV DNA High Risk: NOT DETECTED

## 2012-02-27 ENCOUNTER — Other Ambulatory Visit: Payer: Self-pay | Admitting: Obstetrics and Gynecology

## 2012-02-27 DIAGNOSIS — Z1231 Encounter for screening mammogram for malignant neoplasm of breast: Secondary | ICD-10-CM

## 2012-03-05 ENCOUNTER — Other Ambulatory Visit: Payer: Self-pay | Admitting: Internal Medicine

## 2012-03-05 DIAGNOSIS — R1013 Epigastric pain: Secondary | ICD-10-CM

## 2012-03-10 ENCOUNTER — Other Ambulatory Visit: Payer: Self-pay | Admitting: Internal Medicine

## 2012-03-10 ENCOUNTER — Ambulatory Visit
Admission: RE | Admit: 2012-03-10 | Discharge: 2012-03-10 | Disposition: A | Discharge status: Home / Self Care | Payer: PRIVATE HEALTH INSURANCE | Source: Ambulatory Visit | Attending: Internal Medicine | Admitting: Internal Medicine

## 2012-03-10 DIAGNOSIS — R1013 Epigastric pain: Secondary | ICD-10-CM

## 2012-03-10 MED ORDER — IOHEXOL 350 MG/ML SOLN
80.0000 mL | Freq: Once | INTRAVENOUS | Status: AC | PRN
  Administered 2012-03-10: 80 mL via INTRAVENOUS

## 2012-03-23 ENCOUNTER — Other Ambulatory Visit: Payer: Self-pay | Admitting: Neurosurgery

## 2012-03-25 ENCOUNTER — Ambulatory Visit: Payer: PRIVATE HEALTH INSURANCE

## 2012-03-31 ENCOUNTER — Ambulatory Visit
Admission: RE | Admit: 2012-03-31 | Discharge: 2012-03-31 | Disposition: A | Discharge status: Home / Self Care | Payer: PRIVATE HEALTH INSURANCE | Source: Ambulatory Visit | Attending: Obstetrics and Gynecology | Admitting: Obstetrics and Gynecology

## 2012-03-31 DIAGNOSIS — Z1231 Encounter for screening mammogram for malignant neoplasm of breast: Secondary | ICD-10-CM

## 2012-04-21 ENCOUNTER — Encounter (HOSPITAL_COMMUNITY): Payer: Self-pay

## 2012-04-26 ENCOUNTER — Encounter (HOSPITAL_COMMUNITY): Payer: Self-pay

## 2012-04-26 ENCOUNTER — Encounter (HOSPITAL_COMMUNITY)
Admission: RE | Admit: 2012-04-26 | Discharge: 2012-04-26 | Disposition: A | Discharge status: Home / Self Care | Payer: PRIVATE HEALTH INSURANCE | Source: Ambulatory Visit | Attending: Anesthesiology | Admitting: Anesthesiology

## 2012-04-26 ENCOUNTER — Encounter (HOSPITAL_COMMUNITY)
Admission: RE | Admit: 2012-04-26 | Discharge: 2012-04-26 | Disposition: A | Discharge status: Home / Self Care | Payer: PRIVATE HEALTH INSURANCE | Source: Ambulatory Visit | Attending: Neurosurgery | Admitting: Neurosurgery

## 2012-04-26 HISTORY — DX: Gastro-esophageal reflux disease without esophagitis: K21.9

## 2012-04-26 HISTORY — DX: Reserved for inherently not codable concepts without codable children: IMO0001

## 2012-04-26 HISTORY — DX: Respiratory tuberculosis unspecified: A15.9

## 2012-04-26 LAB — CBC
Platelets: 284 10*3/uL (ref 150–400)
RDW: 12.6 % (ref 11.5–15.5)
WBC: 8.9 10*3/uL (ref 4.0–10.5)

## 2012-04-26 LAB — BASIC METABOLIC PANEL
Calcium: 9.6 mg/dL (ref 8.4–10.5)
Creatinine, Ser: 0.84 mg/dL (ref 0.50–1.10)
GFR calc Af Amer: 88 mL/min — ABNORMAL LOW (ref >90)
Sodium: 140 mEq/L (ref 135–145)

## 2012-04-26 LAB — SURGICAL PCR SCREEN
MRSA, PCR: NEGATIVE
Staphylococcus aureus: NEGATIVE

## 2012-04-26 NOTE — Pre-Procedure Instructions (Signed)
20 APRIL COLTER  04/26/2012   Your procedure is scheduled on:  Wednesday, November 20th  Report to [facility] at 0630 AM.  Call this number if you have problems the morning of surgery: [phone]   Remember:   Do not eat food or drink:After Midnight.   Take these medicines the morning of surgery with A SIP OF WATER: nexium, flonase, synthroid   Do not wear jewelry, make-up or nail polish.  Do not wear lotions, powders, or perfumes.   Do not shave 48 hours prior to surgery.  Do not bring valuables to the hospital.  Contacts, dentures or bridgework may not be worn into surgery.  Leave suitcase in the car. After surgery it may be brought to your room.  For patients admitted to the hospital, checkout time is 11:00 AM the day of discharge.   Patients discharged the day of surgery will not be allowed to drive home.   Special Instructions: Shower using CHG 2 nights before surgery and the night before surgery.  If you shower the day of surgery use CHG.  Use special wash - you have one bottle of CHG for all showers.  You should use approximately 1/3 of the bottle for each shower.   Please read over the following fact sheets that you were given: Pain Booklet, Coughing and Deep Breathing, Blood Transfusion Information, MRSA Information and Surgical Site Infection Prevention

## 2012-05-04 MED ORDER — CEFAZOLIN SODIUM-DEXTROSE 2-3 GM-% IV SOLR
2.0000 g | INTRAVENOUS | Status: DC
  Filled 2012-05-04: qty 50

## 2012-05-04 MED ORDER — DEXAMETHASONE SODIUM PHOSPHATE 10 MG/ML IJ SOLN
10.0000 mg | INTRAMUSCULAR | Status: DC
  Filled 2012-05-04: qty 1

## 2012-05-05 ENCOUNTER — Encounter (HOSPITAL_COMMUNITY): Payer: Self-pay | Admitting: *Deleted

## 2012-05-05 ENCOUNTER — Encounter (HOSPITAL_COMMUNITY): Payer: Self-pay | Admitting: Certified Registered"

## 2012-05-05 ENCOUNTER — Inpatient Hospital Stay (HOSPITAL_COMMUNITY)
Admission: RE | Admit: 2012-05-05 | Discharge: 2012-05-08 | DRG: 460 | Disposition: A | Discharge status: Home / Self Care | Payer: PRIVATE HEALTH INSURANCE | Source: Ambulatory Visit | Attending: Neurosurgery | Admitting: Neurosurgery

## 2012-05-05 ENCOUNTER — Encounter (HOSPITAL_COMMUNITY)
Admission: RE | Disposition: A | Discharge status: Home / Self Care | Payer: Self-pay | Source: Ambulatory Visit | Attending: Neurosurgery

## 2012-05-05 ENCOUNTER — Ambulatory Visit (HOSPITAL_COMMUNITY): Payer: PRIVATE HEALTH INSURANCE | Admitting: Certified Registered"

## 2012-05-05 ENCOUNTER — Ambulatory Visit (HOSPITAL_COMMUNITY): Payer: PRIVATE HEALTH INSURANCE

## 2012-05-05 DIAGNOSIS — I10 Essential (primary) hypertension: Secondary | ICD-10-CM | POA: Diagnosis present

## 2012-05-05 DIAGNOSIS — N952 Postmenopausal atrophic vaginitis: Secondary | ICD-10-CM

## 2012-05-05 DIAGNOSIS — M4310 Spondylolisthesis, site unspecified: Secondary | ICD-10-CM | POA: Diagnosis present

## 2012-05-05 DIAGNOSIS — E039 Hypothyroidism, unspecified: Secondary | ICD-10-CM | POA: Diagnosis present

## 2012-05-05 DIAGNOSIS — M51379 Other intervertebral disc degeneration, lumbosacral region without mention of lumbar back pain or lower extremity pain: Secondary | ICD-10-CM | POA: Diagnosis present

## 2012-05-05 DIAGNOSIS — M5137 Other intervertebral disc degeneration, lumbosacral region: Secondary | ICD-10-CM | POA: Diagnosis present

## 2012-05-05 DIAGNOSIS — Z7982 Long term (current) use of aspirin: Secondary | ICD-10-CM

## 2012-05-05 DIAGNOSIS — I7789 Other specified disorders of arteries and arterioles: Secondary | ICD-10-CM | POA: Diagnosis present

## 2012-05-05 DIAGNOSIS — Z79899 Other long term (current) drug therapy: Secondary | ICD-10-CM

## 2012-05-05 DIAGNOSIS — M48061 Spinal stenosis, lumbar region without neurogenic claudication: Principal | ICD-10-CM | POA: Diagnosis present

## 2012-05-05 DIAGNOSIS — E7800 Pure hypercholesterolemia, unspecified: Secondary | ICD-10-CM | POA: Diagnosis present

## 2012-05-05 DIAGNOSIS — K219 Gastro-esophageal reflux disease without esophagitis: Secondary | ICD-10-CM | POA: Diagnosis present

## 2012-05-05 DIAGNOSIS — Z01812 Encounter for preprocedural laboratory examination: Secondary | ICD-10-CM

## 2012-05-05 DIAGNOSIS — Z01818 Encounter for other preprocedural examination: Secondary | ICD-10-CM

## 2012-05-05 LAB — TYPE AND SCREEN: ABO/RH(D): O POS

## 2012-05-05 LAB — ABO/RH: ABO/RH(D): O POS

## 2012-05-05 SURGERY — POSTERIOR LUMBAR FUSION 2 LEVEL
Anesthesia: General | Site: Back | Wound class: Clean

## 2012-05-05 MED ORDER — LIDOCAINE-EPINEPHRINE 1 %-1:100000 IJ SOLN
INTRAMUSCULAR | Status: DC | PRN
  Administered 2012-05-05: 10 mL

## 2012-05-05 MED ORDER — ESTRADIOL 10 MCG VA TABS: 1.0000 | ORAL_TABLET | VAGINAL | Status: DC

## 2012-05-05 MED ORDER — LIDOCAINE HCL 4 % MT SOLN
OROMUCOSAL | Status: DC | PRN
  Administered 2012-05-05: 4 mL via TOPICAL

## 2012-05-05 MED ORDER — ACETAMINOPHEN 10 MG/ML IV SOLN
INTRAVENOUS | Status: AC
  Filled 2012-05-05: qty 100

## 2012-05-05 MED ORDER — ACETAMINOPHEN 10 MG/ML IV SOLN
INTRAVENOUS | Status: DC | PRN
  Administered 2012-05-05: 1000 mg via INTRAVENOUS

## 2012-05-05 MED ORDER — HYDROMORPHONE HCL PF 1 MG/ML IJ SOLN
0.5000 mg | INTRAMUSCULAR | Status: DC | PRN
  Administered 2012-05-05: 1 mg via INTRAVENOUS

## 2012-05-05 MED ORDER — SODIUM CHLORIDE 0.9 % IJ SOLN: 3.0000 mL | INTRAMUSCULAR | Status: DC | PRN

## 2012-05-05 MED ORDER — SODIUM CHLORIDE 0.9 % IV BOLUS (SEPSIS)
500.0000 mL | Freq: Once | INTRAVENOUS | Status: AC
  Administered 2012-05-06: 500 mL via INTRAVENOUS

## 2012-05-05 MED ORDER — DIPHENHYDRAMINE HCL 50 MG/ML IJ SOLN: 12.5000 mg | Freq: Four times a day (QID) | INTRAMUSCULAR | Status: DC | PRN

## 2012-05-05 MED ORDER — NEOSTIGMINE METHYLSULFATE 1 MG/ML IJ SOLN
INTRAMUSCULAR | Status: DC | PRN
  Administered 2012-05-05: 4 mg via INTRAVENOUS

## 2012-05-05 MED ORDER — ACETAMINOPHEN 650 MG RE SUPP
650.0000 mg | RECTAL | Status: DC | PRN
  Filled 2012-05-05: qty 2

## 2012-05-05 MED ORDER — OXYCODONE HCL 5 MG PO TABS: 5.0000 mg | ORAL_TABLET | Freq: Once | ORAL | Status: DC | PRN

## 2012-05-05 MED ORDER — PHENYLEPHRINE HCL 10 MG/ML IJ SOLN
10.0000 mg | INTRAVENOUS | Status: DC | PRN
  Administered 2012-05-05: 25 ug/min via INTRAVENOUS

## 2012-05-05 MED ORDER — PANTOPRAZOLE SODIUM 40 MG PO TBEC
40.0000 mg | DELAYED_RELEASE_TABLET | Freq: Every day | ORAL | Status: DC
  Administered 2012-05-07: 40 mg via ORAL
  Filled 2012-05-05: qty 1

## 2012-05-05 MED ORDER — BACITRACIN 50000 UNITS IM SOLR
INTRAMUSCULAR | Status: AC
  Filled 2012-05-05: qty 1

## 2012-05-05 MED ORDER — VITAMIN D3 25 MCG (1000 UNIT) PO TABS
2000.0000 [IU] | ORAL_TABLET | Freq: Every day | ORAL | Status: DC
  Administered 2012-05-06 – 2012-05-08 (×3): 2000 [IU] via ORAL
  Filled 2012-05-05 (×4): qty 2

## 2012-05-05 MED ORDER — SODIUM CHLORIDE 0.9 % IV SOLN
INTRAVENOUS | Status: AC
  Filled 2012-05-05: qty 500

## 2012-05-05 MED ORDER — CYCLOBENZAPRINE HCL 10 MG PO TABS
10.0000 mg | ORAL_TABLET | Freq: Three times a day (TID) | ORAL | Status: DC | PRN
  Administered 2012-05-05 – 2012-05-08 (×5): 10 mg via ORAL
  Filled 2012-05-05 (×5): qty 1

## 2012-05-05 MED ORDER — SODIUM CHLORIDE 0.9 % IR SOLN
Status: DC | PRN
  Administered 2012-05-05: 08:00:00

## 2012-05-05 MED ORDER — ALUM & MAG HYDROXIDE-SIMETH 200-200-20 MG/5ML PO SUSP
30.0000 mL | Freq: Four times a day (QID) | ORAL | Status: DC | PRN
  Administered 2012-05-05 – 2012-05-06 (×2): 30 mL via ORAL
  Filled 2012-05-05 (×2): qty 30

## 2012-05-05 MED ORDER — ACETAMINOPHEN 10 MG/ML IV SOLN
1000.0000 mg | Freq: Four times a day (QID) | INTRAVENOUS | Status: DC
  Filled 2012-05-05 (×4): qty 100

## 2012-05-05 MED ORDER — FLUTICASONE PROPIONATE 50 MCG/ACT NA SUSP
1.0000 | Freq: Every day | NASAL | Status: DC
  Filled 2012-05-05: qty 16

## 2012-05-05 MED ORDER — LISINOPRIL 2.5 MG PO TABS
2.5000 mg | ORAL_TABLET | ORAL | Status: DC
Start: 2012-05-05 — End: 2012-05-08
  Filled 2012-05-05 (×2): qty 1

## 2012-05-05 MED ORDER — DIAZEPAM 5 MG/ML IJ SOLN
2.0000 mg | Freq: Once | INTRAMUSCULAR | Status: AC
  Administered 2012-05-05: 1 mg via INTRAVENOUS

## 2012-05-05 MED ORDER — DIAZEPAM 5 MG/ML IJ SOLN
INTRAMUSCULAR | Status: AC
  Filled 2012-05-05: qty 2

## 2012-05-05 MED ORDER — ATORVASTATIN CALCIUM 10 MG PO TABS
10.0000 mg | ORAL_TABLET | Freq: Every day | ORAL | Status: DC
  Administered 2012-05-07: 10 mg via ORAL
  Filled 2012-05-05 (×4): qty 1

## 2012-05-05 MED ORDER — SODIUM CHLORIDE 0.9 % IJ SOLN: 9.0000 mL | INTRAMUSCULAR | Status: DC | PRN

## 2012-05-05 MED ORDER — NALOXONE HCL 0.4 MG/ML IJ SOLN: 0.4000 mg | INTRAMUSCULAR | Status: DC | PRN

## 2012-05-05 MED ORDER — LEVOTHYROXINE SODIUM 75 MCG PO TABS
75.0000 ug | ORAL_TABLET | Freq: Every day | ORAL | Status: DC
  Administered 2012-05-06 – 2012-05-08 (×3): 75 ug via ORAL
  Filled 2012-05-05 (×4): qty 1

## 2012-05-05 MED ORDER — PHENYLEPHRINE HCL 10 MG/ML IJ SOLN
INTRAMUSCULAR | Status: DC | PRN
  Administered 2012-05-05 (×4): 80 ug via INTRAVENOUS

## 2012-05-05 MED ORDER — 0.9 % SODIUM CHLORIDE (POUR BTL) OPTIME
TOPICAL | Status: DC | PRN
  Administered 2012-05-05: 1000 mL

## 2012-05-05 MED ORDER — HYDROMORPHONE 0.3 MG/ML IV SOLN
INTRAVENOUS | Status: DC
Start: 2012-05-05 — End: 2012-05-08

## 2012-05-05 MED ORDER — HYDROCHLOROTHIAZIDE 25 MG PO TABS
25.0000 mg | ORAL_TABLET | Freq: Every day | ORAL | Status: DC
  Administered 2012-05-06: 25 mg via ORAL
  Filled 2012-05-05 (×4): qty 1

## 2012-05-05 MED ORDER — ACETAMINOPHEN 325 MG PO TABS
650.0000 mg | ORAL_TABLET | ORAL | Status: DC | PRN
  Administered 2012-05-06 – 2012-05-08 (×7): 650 mg via ORAL
  Filled 2012-05-05 (×7): qty 2

## 2012-05-05 MED ORDER — ASPIRIN EC 81 MG PO TBEC
81.0000 mg | DELAYED_RELEASE_TABLET | Freq: Every day | ORAL | Status: DC
  Administered 2012-05-06 – 2012-05-08 (×3): 81 mg via ORAL
  Filled 2012-05-05 (×3): qty 1

## 2012-05-05 MED ORDER — GLYCOPYRROLATE 0.2 MG/ML IJ SOLN
INTRAMUSCULAR | Status: DC | PRN
  Administered 2012-05-05: 0.6 mg via INTRAVENOUS

## 2012-05-05 MED ORDER — PHENOL 1.4 % MT LIQD: 1.0000 | OROMUCOSAL | Status: DC | PRN

## 2012-05-05 MED ORDER — ONDANSETRON HCL 4 MG/2ML IJ SOLN
INTRAMUSCULAR | Status: DC | PRN
  Administered 2012-05-05: 4 mg via INTRAVENOUS

## 2012-05-05 MED ORDER — MENTHOL 3 MG MT LOZG
1.0000 | LOZENGE | OROMUCOSAL | Status: DC | PRN
  Filled 2012-05-05: qty 9

## 2012-05-05 MED ORDER — MIDAZOLAM HCL 5 MG/5ML IJ SOLN
INTRAMUSCULAR | Status: DC | PRN
  Administered 2012-05-05: 2 mg via INTRAVENOUS

## 2012-05-05 MED ORDER — HYDROMORPHONE HCL PF 1 MG/ML IJ SOLN: 0.2500 mg | INTRAMUSCULAR | Status: DC | PRN

## 2012-05-05 MED ORDER — ROCURONIUM BROMIDE 100 MG/10ML IV SOLN
INTRAVENOUS | Status: DC | PRN
  Administered 2012-05-05: 50 mg via INTRAVENOUS

## 2012-05-05 MED ORDER — DEXAMETHASONE SODIUM PHOSPHATE 4 MG/ML IJ SOLN
INTRAMUSCULAR | Status: DC | PRN
  Administered 2012-05-05: 8 mg via INTRAVENOUS

## 2012-05-05 MED ORDER — DIPHENHYDRAMINE HCL 12.5 MG/5ML PO ELIX: 12.5000 mg | ORAL_SOLUTION | Freq: Four times a day (QID) | ORAL | Status: DC | PRN

## 2012-05-05 MED ORDER — HEMOSTATIC AGENTS (NO CHARGE) OPTIME
TOPICAL | Status: DC | PRN
  Administered 2012-05-05 (×2): 1 via TOPICAL

## 2012-05-05 MED ORDER — MEPERIDINE HCL 25 MG/ML IJ SOLN: 6.2500 mg | INTRAMUSCULAR | Status: DC | PRN

## 2012-05-05 MED ORDER — SODIUM CHLORIDE 0.9 % IJ SOLN
3.0000 mL | Freq: Two times a day (BID) | INTRAMUSCULAR | Status: DC
  Administered 2012-05-05 – 2012-05-06 (×2): 3 mL via INTRAVENOUS

## 2012-05-05 MED ORDER — THROMBIN 20000 UNITS EX SOLR
CUTANEOUS | Status: DC | PRN
  Administered 2012-05-05 (×2): via TOPICAL

## 2012-05-05 MED ORDER — CEFAZOLIN SODIUM 1-5 GM-% IV SOLN
1.0000 g | Freq: Three times a day (TID) | INTRAVENOUS | Status: AC
  Administered 2012-05-05 – 2012-05-07 (×5): 1 g via INTRAVENOUS
  Filled 2012-05-05 (×8): qty 50

## 2012-05-05 MED ORDER — OXYCODONE HCL 5 MG PO TABS
10.0000 mg | ORAL_TABLET | ORAL | Status: DC | PRN
  Administered 2012-05-05 – 2012-05-08 (×15): 10 mg via ORAL
  Filled 2012-05-05 (×15): qty 2

## 2012-05-05 MED ORDER — OXYCODONE HCL 5 MG/5ML PO SOLN: 5.0000 mg | Freq: Once | ORAL | Status: DC | PRN

## 2012-05-05 MED ORDER — ONDANSETRON HCL 4 MG/2ML IJ SOLN: 4.0000 mg | Freq: Once | INTRAMUSCULAR | Status: DC | PRN

## 2012-05-05 MED ORDER — FENTANYL CITRATE 0.05 MG/ML IJ SOLN
INTRAMUSCULAR | Status: DC | PRN
  Administered 2012-05-05: 150 ug via INTRAVENOUS
  Administered 2012-05-05 (×3): 50 ug via INTRAVENOUS

## 2012-05-05 MED ORDER — ONDANSETRON HCL 4 MG/2ML IJ SOLN: 4.0000 mg | Freq: Four times a day (QID) | INTRAMUSCULAR | Status: DC | PRN

## 2012-05-05 MED ORDER — PROPOFOL 10 MG/ML IV BOLUS
INTRAVENOUS | Status: DC | PRN
  Administered 2012-05-05: 100 mg via INTRAVENOUS

## 2012-05-05 MED ORDER — HYDROMORPHONE HCL PF 1 MG/ML IJ SOLN
INTRAMUSCULAR | Status: AC
  Filled 2012-05-05: qty 1

## 2012-05-05 MED ORDER — LACTATED RINGERS IV SOLN
INTRAVENOUS | Status: DC | PRN
  Administered 2012-05-05 (×3): via INTRAVENOUS

## 2012-05-05 MED ORDER — ACETAMINOPHEN 10 MG/ML IV SOLN
1000.0000 mg | Freq: Four times a day (QID) | INTRAVENOUS | Status: AC
  Administered 2012-05-05 – 2012-05-06 (×4): 1000 mg via INTRAVENOUS
  Filled 2012-05-05 (×4): qty 100

## 2012-05-05 MED ORDER — SODIUM CHLORIDE 0.9 % IV SOLN
250.0000 mL | INTRAVENOUS | Status: DC
  Administered 2012-05-05: 250 mL via INTRAVENOUS

## 2012-05-05 MED ORDER — LIDOCAINE HCL (CARDIAC) 20 MG/ML IV SOLN
INTRAVENOUS | Status: DC | PRN
  Administered 2012-05-05: 60 mg via INTRAVENOUS

## 2012-05-05 MED ORDER — ONDANSETRON HCL 4 MG/2ML IJ SOLN
4.0000 mg | INTRAMUSCULAR | Status: DC | PRN
  Administered 2012-05-05: 4 mg via INTRAVENOUS
  Filled 2012-05-05: qty 2

## 2012-05-05 SURGICAL SUPPLY — 69 items
ADH SKN CLS APL DERMABOND .7 (GAUZE/BANDAGES/DRESSINGS) ×1
APL SKNCLS STERI-STRIP NONHPOA (GAUZE/BANDAGES/DRESSINGS) ×1
BAG DECANTER FOR FLEXI CONT (MISCELLANEOUS) ×2 IMPLANT
BENZOIN TINCTURE PRP APPL 2/3 (GAUZE/BANDAGES/DRESSINGS) ×2 IMPLANT
BLADE SURG 11 STRL SS (BLADE) ×2 IMPLANT
BLADE SURG ROTATE 9660 (MISCELLANEOUS) IMPLANT
BRUSH SCRUB EZ PLAIN DRY (MISCELLANEOUS) ×2 IMPLANT
BUR MATCHSTICK NEURO 3.0 LAGG (BURR) ×3 IMPLANT
BUR PRECISION FLUTE 6.0 (BURR) ×3 IMPLANT
CANISTER SUCTION 2500CC (MISCELLANEOUS) ×2 IMPLANT
CAP LOCKING REVERE (Cap) ×6 IMPLANT
CLOTH BEACON ORANGE TIMEOUT ST (SAFETY) ×2 IMPLANT
CONNECTOR VAR CROSS 5.5X48-60 (Connector) ×1 IMPLANT
CONT SPEC 4OZ CLIKSEAL STRL BL (MISCELLANEOUS) ×5 IMPLANT
COVER BACK TABLE 24X17X13 BIG (DRAPES) ×1 IMPLANT
COVER TABLE BACK 60X90 (DRAPES) ×1 IMPLANT
DECANTER SPIKE VIAL GLASS SM (MISCELLANEOUS) ×2 IMPLANT
DERMABOND ADVANCED (GAUZE/BANDAGES/DRESSINGS) ×1
DERMABOND ADVANCED .7 DNX12 (GAUZE/BANDAGES/DRESSINGS) ×1 IMPLANT
DRAPE C-ARM 42X72 X-RAY (DRAPES) ×4 IMPLANT
DRAPE LAPAROTOMY 100X72X124 (DRAPES) ×2 IMPLANT
DRAPE POUCH INSTRU U-SHP 10X18 (DRAPES) ×3 IMPLANT
DRAPE PROXIMA HALF (DRAPES) IMPLANT
DRAPE SURG 17X23 STRL (DRAPES) ×2 IMPLANT
DRSG OPSITE 4X5.5 SM (GAUZE/BANDAGES/DRESSINGS) ×2 IMPLANT
ELECT REM PT RETURN 9FT ADLT (ELECTROSURGICAL) ×2
ELECTRODE REM PT RTRN 9FT ADLT (ELECTROSURGICAL) ×1 IMPLANT
EVACUATOR 3/16  PVC DRAIN (DRAIN) ×2
EVACUATOR 3/16 PVC DRAIN (DRAIN) ×2 IMPLANT
GAUZE SPONGE 4X4 16PLY XRAY LF (GAUZE/BANDAGES/DRESSINGS) IMPLANT
GLOVE BIO SURGEON STRL SZ8 (GLOVE) ×4 IMPLANT
GLOVE BIOGEL PI IND STRL 7.0 (GLOVE) IMPLANT
GLOVE BIOGEL PI INDICATOR 7.0 (GLOVE) ×1
GLOVE ECLIPSE 6.5 STRL STRAW (GLOVE) ×1 IMPLANT
GLOVE EXAM NITRILE LRG STRL (GLOVE) IMPLANT
GLOVE EXAM NITRILE MD LF STRL (GLOVE) ×2 IMPLANT
GLOVE EXAM NITRILE XL STR (GLOVE) IMPLANT
GLOVE EXAM NITRILE XS STR PU (GLOVE) IMPLANT
GLOVE INDICATOR 8.5 STRL (GLOVE) ×4 IMPLANT
GLOVE OPTIFIT SS 6.5 STRL BRWN (GLOVE) ×3 IMPLANT
GOWN BRE IMP SLV AUR LG STRL (GOWN DISPOSABLE) ×1 IMPLANT
GOWN BRE IMP SLV AUR XL STRL (GOWN DISPOSABLE) ×5 IMPLANT
GOWN STRL REIN 2XL LVL4 (GOWN DISPOSABLE) IMPLANT
KIT BASIN OR (CUSTOM PROCEDURE TRAY) ×2 IMPLANT
KIT ROOM TURNOVER OR (KITS) ×2 IMPLANT
MILL MEDIUM DISP (BLADE) ×1 IMPLANT
NDL HYPO 25X1 1.5 SAFETY (NEEDLE) ×1 IMPLANT
NEEDLE HYPO 25X1 1.5 SAFETY (NEEDLE) ×2 IMPLANT
NS IRRIG 1000ML POUR BTL (IV SOLUTION) ×2 IMPLANT
PACK LAMINECTOMY NEURO (CUSTOM PROCEDURE TRAY) ×2 IMPLANT
PAD ARMBOARD 7.5X6 YLW CONV (MISCELLANEOUS) ×6 IMPLANT
PUTTY BONE DBX 5CC MIX (Putty) ×1 IMPLANT
ROD CURVED 5.5MMX75MM (Rod) ×4 IMPLANT
SCREW PEDICLE 6.5MMX35MM (Screw) ×2 IMPLANT
SCREW PEDICLE 6.5X40MM (Screw) ×4 IMPLANT
SPACER CALIBER 10X22 9-13MM-12 (Spacer) ×4 IMPLANT
SPONGE GAUZE 4X4 12PLY (GAUZE/BANDAGES/DRESSINGS) ×2 IMPLANT
SPONGE LAP 4X18 X RAY DECT (DISPOSABLE) IMPLANT
SPONGE SURGIFOAM ABS GEL 100 (HEMOSTASIS) ×2 IMPLANT
STRIP CLOSURE SKIN 1/2X4 (GAUZE/BANDAGES/DRESSINGS) ×3 IMPLANT
SUT VIC AB 0 CT1 18XCR BRD8 (SUTURE) ×1 IMPLANT
SUT VIC AB 0 CT1 8-18 (SUTURE) ×2
SUT VIC AB 2-0 CT1 18 (SUTURE) ×3 IMPLANT
SUT VICRYL 4-0 PS2 18IN ABS (SUTURE) ×2 IMPLANT
SYR 20ML ECCENTRIC (SYRINGE) ×2 IMPLANT
TOWEL OR 17X24 6PK STRL BLUE (TOWEL DISPOSABLE) ×2 IMPLANT
TOWEL OR 17X26 10 PK STRL BLUE (TOWEL DISPOSABLE) ×2 IMPLANT
TRAY FOLEY CATH 14FRSI W/METER (CATHETERS) ×2 IMPLANT
WATER STERILE IRR 1000ML POUR (IV SOLUTION) ×2 IMPLANT

## 2012-05-05 NOTE — Anesthesia Preprocedure Evaluation (Addendum)
Anesthesia Evaluation  Patient identified by MRN, date of birth, ID band Patient awake    Reviewed: Allergy & Precautions, H&P , NPO status , Patient's Chart, lab work & pertinent test results, reviewed documented beta blocker date and time   Airway Mallampati: I TM Distance: >3 FB Neck ROM: Full    Dental  (+) Dental Advisory Given and Teeth Intact   Pulmonary  breath sounds clear to auscultation        Cardiovascular hypertension, Pt. on medications Rhythm:Regular Rate:Normal     Neuro/Psych    GI/Hepatic GERD-  Medicated and Controlled,  Endo/Other  Hypothyroidism   Renal/GU      Musculoskeletal   Abdominal (+)  Abdomen: soft. Bowel sounds: normal.  Peds  Hematology   Anesthesia Other Findings   Reproductive/Obstetrics                         Anesthesia Physical Anesthesia Plan  ASA: II  Anesthesia Plan: General   Post-op Pain Management:    Induction: Intravenous  Airway Management Planned: Oral ETT  Additional Equipment:   Intra-op Plan:   Post-operative Plan: Extubation in OR  Informed Consent: I have reviewed the patients History and Physical, chart, labs and discussed the procedure including the risks, benefits and alternatives for the proposed anesthesia with the patient or authorized representative who has indicated his/her understanding and acceptance.     Plan Discussed with: CRNA and Surgeon  Anesthesia Plan Comments:         Anesthesia Quick Evaluation

## 2012-05-05 NOTE — Progress Notes (Addendum)
Pt had recent  tx with ampicillin for toothache.   Also tx with e-mycin ointment for a sty on the left eye.

## 2012-05-05 NOTE — Transfer of Care (Signed)
Immediate Anesthesia Transfer of Care Note  Patient: Judith Mcdonald  Procedure(s) Performed: Procedure(s) (LRB) with comments: POSTERIOR LUMBAR FUSION 2 LEVEL (N/A) - posterior lumbar four-five ,five sacral one interbody fusion  Patient Location: PACU  Anesthesia Type:General  Level of Consciousness: awake and alert   Airway & Oxygen Therapy: Patient Spontanous Breathing  Post-op Assessment: Report given to PACU RN  Post vital signs: stable  Complications: No apparent anesthesia complications

## 2012-05-05 NOTE — OR Nursing (Signed)
diascussed b[p 80's/50's with dr Michelle Piper ( preop  sbp 107)  / states he is ok with transport to floor

## 2012-05-05 NOTE — Preoperative (Signed)
Beta Blockers   Reason not to administer Beta Blockers:Not Applicable 

## 2012-05-05 NOTE — Op Note (Signed)
Preoperative diagnosis: Grade 1 spondylolisthesis and lumbar spinal stenosis L4-5, lumbar spinal stenosis and degenerative disease L5-S1 with severe by foraminal stenosis of the L4, L5, and S1 nerve roots  Postoperative diagnosis: Same  Procedure: #1 Gill decompressive lumbar laminectomy the L4-5  #2 decompressive lumbar laminectomy L5-S1 and excess will be needed with a standard interbody fusion  #3 posterior lumbar interbody fusion L4-5 and L5-S1 using a caliber expandable peek cages packed with local autograft mixed with DBX  #4 pedicle screw fixation L4-S1 using the 5.5 globus Revere pedicle screw system  #5 posterior lateral arthrodesis L4-S1 using local autograft mixed with DBX  #6 open reduction spinal deformity  #7 placement of a large Hemovac drain  Surgeon: Jillyn Hidden Emberlee Sortino  Assistant: Coletta Memos  Anesthesia: Gen.  EBL: 300  History of present illness: Patient is a very pleasant 56 year old female is a long-standing chronic low back pain is getting progressively worse over last several months and years the pain would radiate her back was become predominantly mechanical in nature would radiate down both legs but only on the left than the backs legs wraparound in the front shin top of the foot and occasionally outside of that by mouth are consistent with both an L4, L5, and S1 nerve root pattern throughout the years patient failed all forms of conservative treatment with extensive [facility] physical therapy epidural steroid injections anti-inflammatories and time and due to her progression of clinical syndrome and imaging findings showing severe spinal stenosis at L4-5 and L5-S1 with severe by foraminal stenosis of the L4-L5 and S1 nerve roots as well as a grade 1 spondylolisthesis and degenerative disc disease I have recommended a decompression stable position procedure at L4-5 L5-S1 I extensively reviewed the risks and benefits of the operation as well as perioperative course and  expectations of outcome and alternatives of surgery and she understands and agrees to proceed forward.  Operative procedure: Patient was brought into the or was induced under general anesthesia positioned prone the Wilson frame her back was prepped and draped in routine sterile fashion after infiltration 10 cc lidocaine with epi a midline incision was made and Bovie light car was used to take down the subcutaneous tissues and subperiosteal dissections care lamina of L3-4-5 and S1 bilaterally exposing the TPS at L4-5 and S1 bilaterally. Interoperative X. identify the pedicle of L5 there was marked facet arthropathy at L4-5 there was a large synovial cyst coming off the outside of facet complex on the right L4-5 amount of epidural fibrosis the or was marked instability of the spinal laminar process of L4 and after adequate exposure been achieved interoperative confirm the appropriate level the spinous processes at L5 and L4 removed central decompression was begun at L5 margins superiorly until a complete laminectomy of it does L4 and L5 the severe hourglass compression of thecal sac with a fair amount of adhesions of the dura to the service of the facet complex so first working at L5 and then superiorly up to the disc space at L4-5 the L5 pedicle was identified complete medial facetectomies were performed at L4-5 the L4 nerve was markedly stenotic and drill off the inferior medial aspect of the L4 pedicle bilaterally to gain access and underbite the facet complex to superior tickling facet complex adequately decompress the L4 nerve root and further abutting super to cutting facet complex a ladder axis the lateral margins disc space marking inferiorly complete decompression was complete facetectomies were also performed at L5-S1 decompressing both the L4, L5 and  S1 nerve roots in their entirety. Then after adequate decompression achieved pedicles replacement was initiated using a high-speed drill a pilot hole was  drilled cannulated with the awl probed O55 Probed again a 6 5 x 40 screw inserted this procedure was repeated at L4-L5 and S1 bilaterally fluoroscopy used the step along the way to confirm no medial or lateral breech direct inspection from within and external to the canal as well as probing with the pedicle confirm this. 6 x 35 screws were inserted S1 was 6 x 40 screws at L4 and L5. After all screws in place is to attention was taken to the interbody work on disc spaces cleanout on both sides first a 7 distractor was inserted L4-5 help reduce the deformity initially I sequentially distracted with a distractor on the left and then back to 9 distractor on the right and 80 to the remainder the discectomy and endplate preparation with a 9 Distractor in Pl. The distractor significantly reduce the deformity as well as over the disc space so it was felt to be appropriate sizing for the implants so disc space and cleanout the left with Epstein curettes suture rongeurs and rotating cutters. After adequate of the progression achieved and 9 expandable cage by 10 mm with by 20 mm slight was inserted after being packed with local are graft mixed DBX and this was opened up proximally 3 clicks which would put it to about 10-10 a half millimeters. Then the distractor was removed and 10 seconds L5-S1 similar fashion L5-S1 disc space was markedly collapsed sequential distraction carried up to 9 as well and then 9 cage was inserted on the left side at L5-S1 after adequate endplate preparation been achieved. Then working on the right side the spaces cleanout again endplates were prepared low localizer was packed centrally against the contralateral cage and its lateral cages were then inserted after all 4 cages were placed fluoroscopy used both lateral and AP confirmed good positioning of the screws and implants. Then the wound scope to irrigate fixing space was maintained aggressive decortication was care MTPs or lateral gutters local  are graft pack posterior laterally along the TPS from L4-S1 and 75 mm rods were placed top tightness tightened at S1 the L5 screws compressing is to S1 the L4 compressing his L5 the cross-link was applied L. the foramina were reinspected prior cross-link application the because he states was maintained Gelfoam was laid top of the dura a large Hemovac was placed and the wounds closed in layers with after Vicryl and running 4'0 subcuticular or benzoin and Steris were applied patient recovered in stable condition. At the end of case on it counts sponge counts were correct per the nurses.

## 2012-05-05 NOTE — Anesthesia Postprocedure Evaluation (Signed)
Anesthesia Post Note  Patient: Judith Mcdonald  Procedure(s) Performed: Procedure(s) (LRB): POSTERIOR LUMBAR FUSION 2 LEVEL (N/A)  Anesthesia type: general  Patient location: PACU  Post pain: Pain level controlled  Post assessment: Patient's Cardiovascular Status Stable  Last Vitals:  Filed Vitals:   05/05/12 1523  BP: 91/57  Pulse: 59  Temp:   Resp:     Post vital signs: Reviewed and stable  Level of consciousness: sedated  Complications: No apparent anesthesia complications

## 2012-05-05 NOTE — H&P (Signed)
Judith Mcdonald is an 56 y.o. female.   Chief Complaint: Back and leg pain HPI: Patient is a 56 year old female is a long-standing back and bilateral leg pain refractory to all forms of conservative treatment of years with anti-inflammatories and physical therapy and pain medication and steroids. The pain predominantly were is in her back is mechanical in nature worsened over last incision position radiates down her legs occasionally in the back of her legs front of her shin top of her feet with occasional numbness tingling his in this region. She denies any bowel bladder trouble. Imaging revealed severe lumbar spinal stenosis at L4-5 L5-S1 degenerative changes grade 1 spondylolisthesis L4-5 marked facet arthropathy and bifrontal stenosis of the L4-L5 and S1 nerve roots. Due to progression of clinical syndrome and imaging findings and takes her treatment we have recommended a decompression stabilization procedure at L4-5 L5-S1. I have extensively reviewed the risks benefits of the operation as well as therapy course expectations of outcome alternatives of surgery she understands and agrees to proceed forward.  Past Medical History  Diagnosis Date  . H/O typhus     at age 83 yrs  . Hx: UTI (urinary tract infection)   . Hx of menorrhagia   . Irregular menses   . Hypothyroidism   . Hx of migraines     with menses  . Hx of amenorrhea   . History of recurrent UTIs   . Mastodynia   . Hypercholesteremia   . Hypertension   . Fibromuscular dysplasia   . GERD (gastroesophageal reflux disease)   . 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 Normal cardiac stress test 2013    Dr Donnie Aho    Past Surgical History  Procedure Date  . Cesarean section     2x in 1984 & 1986  . Nasal septum surgery   . Cardiac catheterization 2013    [facility] [place]    Family History  Problem Relation Age of Onset  . Hypertension Father   . Cancer Father   . Heart disease Maternal Grandmother     Social History:  reports that she has never smoked. She has never used smokeless tobacco. She reports that she drinks alcohol. She reports that she does not use illicit drugs.  Allergies: No Known Allergies  Medications Prior to Admission  Medication Sig Dispense Refill  . aspirin EC 81 MG tablet Take 81 mg by mouth daily.      . Calcium Carbonate-Vitamin D (CALCIUM PLUS VITAMIN D PO) Take 1 tablet by mouth daily.      . cholecalciferol (VITAMIN D) 1000 UNITS tablet Take 2,000 Units by mouth daily.      Marland Kitchen esomeprazole (NEXIUM) 40 MG capsule Take 40 mg by mouth daily before breakfast.      . Estradiol (VAGIFEM) 10 MCG TABS Place 1 tablet (10 mcg total) vaginally 2 (two) times a week.  24 tablet  0  . fluticasone (FLONASE) 50 MCG/ACT nasal spray Place 1-2 sprays into the nose daily.      . hydrochlorothiazide (HYDRODIURIL) 25 MG tablet Take 25 mg by mouth daily.      Marland Kitchen levothyroxine (SYNTHROID, LEVOTHROID) 75 MCG tablet Take 75 mcg by mouth daily.      Marland Kitchen lisinopril (PRINIVIL,ZESTRIL) 10 MG tablet Take 2.5 mg by mouth every other day. Takes 1/4 tab daily      . nitrofurantoin (MACRODANTIN) 50 MG capsule Take 1 capsule (50 mg total) by mouth as needed.  1 capsule  12  . Omega-3 Fatty Acids (FISH OIL) 1200 MG CAPS Take 1 capsule by mouth every other day.      . rosuvastatin (CRESTOR) 10 MG tablet Take 5 mg by mouth daily.       . Estradiol (VAGIFEM) 10 MCG TABS Place 1 tablet vaginally See admin instructions. Every 3 weeks        No results found for this or any previous visit (from the past 48 hour(s)). No results found.  Review of Systems  Constitutional: Negative.   HENT: Negative.   Eyes: Negative.   Respiratory: Negative.   Cardiovascular: Negative.   Gastrointestinal: Negative.   Genitourinary: Negative.   Musculoskeletal: Positive for myalgias and back pain.  Skin: Negative.   Neurological: Positive for tremors and sensory change.    Blood pressure 107/75, pulse 68,  temperature 97.7 F (36.5 C), temperature source Oral, resp. rate 18, SpO2 97.00%. Physical Exam  Constitutional: She is oriented to person, place, and time. She appears well-developed.  HENT:  Head: Normocephalic and atraumatic.  Eyes: Pupils are equal, round, and reactive to light.  Respiratory: Effort normal.  GI: Soft.  Neurological: She is alert and oriented to person, place, and time. She has normal strength. GCS eye subscore is 4. GCS verbal subscore is 5. GCS motor subscore is 6.  Reflex Scores:      Patellar reflexes are 1+ on the right side and 1+ on the left side.      Achilles reflexes are 1+ on the right side and 1+ on the left side.    Assessment/Plan 56 year old female with long-standing back and bilateral leg pain worse on the left presents for an L4-5 L5-S1 posterior lumbar interbody fusion  Peg Fifer P 05/05/2012, 8:00 AM

## 2012-05-05 NOTE — Anesthesia Procedure Notes (Signed)
Procedure Name: Intubation Date/Time: 05/05/2012 8:43 AM Performed by: Ellin Goodie Pre-anesthesia Checklist: Patient identified, Emergency Drugs available, Suction available, Patient being monitored and Timeout performed Patient Re-evaluated:Patient Re-evaluated prior to inductionOxygen Delivery Method: Circle system utilized Preoxygenation: Pre-oxygenation with 100% oxygen Intubation Type: IV induction Ventilation: Mask ventilation without difficulty Laryngoscope Size: Mac and 4 Grade View: Grade I Tube type: Oral Tube size: 7.5 mm Number of attempts: 1 Airway Equipment and Method: Stylet and LTA kit utilized Placement Confirmation: ETT inserted through vocal cords under direct vision,  positive ETCO2 and breath sounds checked- equal and bilateral Secured at: 22 cm Tube secured with: Tape Dental Injury: Teeth and Oropharynx as per pre-operative assessment

## 2012-05-06 MED ORDER — SODIUM CHLORIDE 0.9 % IV BOLUS (SEPSIS)
1000.0000 mL | Freq: Once | INTRAVENOUS | Status: AC
  Administered 2012-05-06: 1000 mL via INTRAVENOUS

## 2012-05-06 NOTE — Progress Notes (Signed)
Subjective: Patient reports She's feeling better she has no leg pain her back is sore she is experiencing numbness in the right side of her face all around her left  Objective: Vital signs in last 24 hours: Temp:  [97.3 F (36.3 C)-98.4 F (36.9 C)] 98.4 F (36.9 C) (11/21 0600) Pulse Rate:  [56-70] 63  (11/21 0600) Resp:  [9-20] 16  (11/21 0600) BP: (74-91)/(44-57) 85/52 mmHg (11/21 0600) SpO2:  [95 %-100 %] 97 % (11/21 0600) Weight:  [66.225 kg (146 lb)] 66.225 kg (146 lb) (11/20 1430)  Intake/Output from previous day: 11/20 0701 - 11/21 0700 In: 2600 [I.V.:2500; Blood:100] Out: 2260 [Urine:1885; Drains:75; Blood:300] Intake/Output this shift:    She is awake alert oriented Kaladin Noseworthy nerves are intact to see a little bit of swelling in the right center face but does not appear that she has a seventh nerve palsy lower extremity strength is 5 out of 5 wound is clean and dry  Lab Results: No results found for this basename: WBC:2,HGB:2,HCT:2,PLT:2 in the last 72 hours BMET No results found for this basename: NA:2,K:2,CL:2,CO2:2,GLUCOSE:2,BUN:2,CREATININE:2,CALCIUM:2 in the last 72 hours  Studies/Results: Dg Lumbar Spine 2-3 Views  05/05/2012  *RADIOLOGY REPORT*  Clinical Data: Fusion surgery  LUMBAR SPINE - 2-3 VIEW  Comparison: CT 03/10/2012  Findings: Two spot images from intraoperative fluoroscopy document bilateral pedicle screw placement L4, L5, and S1.  Graft markers project in the intervening interspaces.  IMPRESSION:  P L I F L4-S1.   Original Report Authenticated By: D. Andria Rhein, MD     Assessment/Plan: Progressive mobilization in physical therapy blood pressure is low but will bolus her with a liters saline  LOS: 1 day     Rain Wilhide P 05/06/2012, 8:02 AM

## 2012-05-06 NOTE — Evaluation (Signed)
Physical Therapy Evaluation Patient Details Name: Judith Mcdonald MRN: [id number] DOB: 11/14/1955 Today's Date: 05/06/2012 Time: 4098-1191 PT Time Calculation (min): 40 min  PT Assessment / Plan / Recommendation Clinical Impression  Pt s/p PLF L4-S1. Pt moving well this session, although limited by low BP and pain. Pt will benefit from skilled PT in the acute care setting in order to maximize functional mobility and safety prior to d/c home with husband and family    PT Assessment  Patient needs continued PT services    Follow Up Recommendations  No PT follow up;Supervision - Intermittent    Does the patient have the potential to tolerate intense rehabilitation      Barriers to Discharge        Equipment Recommendations  Rolling walker with 5" wheels    Recommendations for Other Services     Frequency Min 5X/week    Precautions / Restrictions Precautions Precautions: Back Precaution Booklet Issued: Yes (comment) Precaution Comments: pt educated on 3/3 back precautions Required Braces or Orthoses: Spinal Brace Spinal Brace: Lumbar corset;Applied in sitting position Restrictions Weight Bearing Restrictions: No   Pertinent Vitals/Pain Pain 8/10. RN given pain meds prior to session.       Mobility  Bed Mobility Bed Mobility: Rolling Right;Right Sidelying to Sit;Sitting - Scoot to Edge of Bed Rolling Right: 4: Min assist Right Sidelying to Sit: 4: Min assist Sitting - Scoot to Edge of Bed: 4: Min assist Details for Bed Mobility Assistance: VC for safe sequencing to maintain back precautions during transfer. Min assist through trunk for support into sitting Transfers Transfers: Stand to Sit;Sit to Stand Sit to Stand: 4: Min assist Stand to Sit: 4: Min assist Details for Transfer Assistance: VC for proper sequencing and proper hand placement. Assist for controlled ascent/descent Ambulation/Gait Ambulation/Gait Assistance: 4: Min assist Ambulation Distance (Feet): 20  Feet Assistive device: 2 person hand held assist Ambulation/Gait Assistance Details: Assist for stability. Short distance secondary to low BP. Pt with short steps and complained of legs feeling weird, although moving well. Will attempt RW next session for increased independence and stability Gait Pattern: Decreased stride length;Decreased hip/knee flexion - right;Decreased hip/knee flexion - left;Narrow base of support Gait velocity: slow gait speed Stairs: No    Shoulder Instructions     Exercises     PT Diagnosis: Difficulty walking;Acute pain  PT Problem List: Decreased activity tolerance;Decreased mobility;Decreased knowledge of use of DME;Decreased safety awareness;Decreased knowledge of precautions;Pain PT Treatment Interventions: DME instruction;Gait training;Stair training;Functional mobility training;Therapeutic activities;Patient/family education   PT Goals Acute Rehab PT Goals PT Goal Formulation: With patient/family Time For Goal Achievement: 05/13/12 Potential to Achieve Goals: Good Pt will go Supine/Side to Sit: with modified independence PT Goal: Supine/Side to Sit - Progress: Goal set today Pt will go Sit to Supine/Side: with modified independence PT Goal: Sit to Supine/Side - Progress: Goal set today Pt will go Sit to Stand: with modified independence PT Goal: Sit to Stand - Progress: Goal set today Pt will go Stand to Sit: with modified independence PT Goal: Stand to Sit - Progress: Goal set today Pt will Transfer Bed to Chair/Chair to Bed: with modified independence PT Transfer Goal: Bed to Chair/Chair to Bed - Progress: Goal set today Pt will Ambulate: >150 feet;with modified independence;with least restrictive assistive device PT Goal: Ambulate - Progress: Goal set today Pt will Go Up / Down Stairs: 1-2 stairs;with min assist PT Goal: Up/Down Stairs - Progress: Goal set today  Visit Information  Last PT  Received On: 05/06/12 Assistance Needed: +1      Subjective Data  Patient Stated Goal: to go home tomorrow   Prior Functioning  Home Living Lives With: Spouse Available Help at Discharge: Family;Available 24 hours/day Type of Home: House Home Access: Stairs to enter [employer] of Steps: 2 Entrance Stairs-Rails: None Home Layout: Two level;Able to live on main level with bedroom/bathroom Alternate Level Stairs-Number of Steps: 12 Alternate Level Stairs-Rails: Right Bathroom Shower/Tub: Walk-in shower;Door [employer] Toilet: Standard Bathroom Accessibility: Yes How Accessible: Accessible via walker Home Adaptive Equipment: Bedside commode/3-in-1 Prior Function Level of Independence: Independent Able to Take Stairs?: Yes Driving: Yes Vocation: Unemployed Communication Communication: No difficulties Dominant Hand: Right    Cognition  Overall Cognitive Status: Appears within functional limits for tasks assessed/performed Arousal/Alertness: Awake/alert Orientation Level: Appears intact for tasks assessed Behavior During Session: [facility] for tasks performed    Extremity/Trunk Assessment Right Lower Extremity Assessment RLE ROM/Strength/Tone: Within functional levels RLE Sensation: WFL - Light Touch Left Lower Extremity Assessment LLE ROM/Strength/Tone: Within functional levels LLE Sensation: WFL - Light Touch   Balance    End of Session PT - End of Session Equipment Utilized During Treatment: Gait belt;Back brace Activity Tolerance: Patient tolerated treatment well Patient left: in chair;with call bell/phone within reach;with family/visitor present;with nursing in room Nurse Communication: Mobility status    Milana Kidney 05/06/2012, 9:33 AM  05/06/2012 Milana Kidney DPT PAGER: [phone] OFFICE: [phone]

## 2012-05-06 NOTE — Evaluation (Signed)
Occupational Therapy Evaluation Patient Details Name: Judith Mcdonald MRN: [id number] DOB: Jun 08, 1956 Today's Date: 05/06/2012 Time: 4098-1191 OT Time Calculation (min): 17 min  OT Assessment / Plan / Recommendation Clinical Impression  Pt is recovering from PLIF.  Moving well for post op day one.  Will follow acutely to address deficit areas.  No post hospital OT needs anticipated.    OT Assessment  Patient needs continued OT Services    Follow Up Recommendations  Supervision/Assistance - 24 hour;No OT follow up    Barriers to Discharge      Equipment Recommendations  Rolling walker with 5" wheels    Recommendations for Other Services    Frequency  Min 2X/week    Precautions / Restrictions Precautions Precautions: Fall;Back Precaution Booklet Issued: Yes (comment) Precaution Comments: pt educated on 3/3 back precautions Required Braces or Orthoses: Spinal Brace Spinal Brace: Lumbar corset;Applied in sitting position Restrictions Weight Bearing Restrictions: No   Pertinent Vitals/Pain 8/10 back pain, RN notified    ADL  Eating/Feeding: Independent Where Assessed - Eating/Feeding: Chair Grooming: Set up Where Assessed - Grooming: Unsupported sitting Upper Body Bathing: Minimal assistance Where Assessed - Upper Body Bathing: Unsupported sitting Lower Body Bathing: Minimal assistance Where Assessed - Lower Body Bathing: Supported sit to stand Upper Body Dressing: Minimal assistance Where Assessed - Upper Body Dressing: Unsupported sitting Lower Body Dressing: Minimal assistance Where Assessed - Lower Body Dressing: Supported sit to stand Toilet Transfer: Minimal assistance Toilet Transfer Method: Sit to stand Equipment Used: Gait belt;Back brace Transfers/Ambulation Related to ADLs: min assist with hands held for transfers and ambulation ADL Comments: Pt nearly able to reach her feet crossing foot over opposite knee.  Limited by pain.  Has a 3 in1, instructed pt she  may use in over her toilet and as a shower seat.  Educated in back precautions related to IADL.  Will have availability of husband at home.  Recommended reacher.    OT Diagnosis: Generalized weakness;Acute pain  OT Problem List: Decreased activity tolerance;Decreased strength;Impaired balance (sitting and/or standing);Pain;Decreased knowledge of use of DME or AE;Decreased knowledge of precautions OT Treatment Interventions: Self-care/ADL training;DME and/or AE instruction;Patient/family education   OT Goals Acute Rehab OT Goals OT Goal Formulation: With patient Time For Goal Achievement: 05/13/12 Potential to Achieve Goals: Good ADL Goals Pt Will Perform Grooming: with modified independence;Standing at sink ADL Goal: Grooming - Progress: Goal set today Pt Will Perform Lower Body Bathing: with modified independence;Sit to stand from bed ADL Goal: Lower Body Bathing - Progress: Goal set today Pt Will Perform Lower Body Dressing: with modified independence;Sit to stand from bed ADL Goal: Lower Body Dressing - Progress: Goal set today Pt Will Transfer to Toilet: with modified independence;Ambulation;Comfort height toilet;Maintaining back safety precautions ADL Goal: Toilet Transfer - Progress: Goal set today Pt Will Perform Toileting - Clothing Manipulation: with modified independence;Standing ADL Goal: Toileting - Clothing Manipulation - Progress: Goal set today Pt Will Perform Toileting - Hygiene: with modified independence;Sit to stand from 3-in-1/toilet ADL Goal: Toileting - Hygiene - Progress: Goal set today Pt Will Perform Tub/Shower Transfer: Shower transfer;Ambulation;with DME;Maintaining back safety precautions;with supervision ADL Goal: Tub/Shower Transfer - Progress: Goal set today Miscellaneous OT Goals Miscellaneous OT Goal #1: Pt will donn back brace independently. OT Goal: Miscellaneous Goal #1 - Progress: Goal set today Miscellaneous OT Goal #2: Pt will generalize back  precautions in ADL independently. OT Goal: Miscellaneous Goal #2 - Progress: Goal set today  Visit Information  Last OT Received On: 05/06/12  Assistance Needed: +1 PT/OT Co-Evaluation/Treatment: Yes    Subjective Data  Subjective: "My legs feel weird." Patient Stated Goal: Return to prior level of function.   Prior Functioning     Home Living Lives With: Spouse Available Help at Discharge: Family;Available 24 hours/day Type of Home: House Home Access: Stairs to enter [employer] of Steps: 2 Entrance Stairs-Rails: None Home Layout: Two level;Able to live on main level with bedroom/bathroom Alternate Level Stairs-Number of Steps: 12 Alternate Level Stairs-Rails: Right Bathroom Shower/Tub: Walk-in shower;Door [employer] Toilet: Standard Bathroom Accessibility: Yes How Accessible: Accessible via walker Home Adaptive Equipment: Bedside commode/3-in-1 Prior Function Level of Independence: Independent Able to Take Stairs?: Yes Driving: Yes Vocation: Unemployed Communication Communication: No difficulties Dominant Hand: Right         Vision/Perception     Cognition  Overall Cognitive Status: Appears within functional limits for tasks assessed/performed Arousal/Alertness: Awake/alert Orientation Level: Appears intact for tasks assessed Behavior During Session: [facility] for tasks performed    Extremity/Trunk Assessment Right Upper Extremity Assessment RUE ROM/Strength/Tone: Within functional levels Left Upper Extremity Assessment LUE ROM/Strength/Tone: Within functional levels  Trunk Assessment Trunk Assessment: Normal     Mobility Bed Mobility Bed Mobility: Rolling Right;Right Sidelying to Sit;Sitting - Scoot to Edge of Bed Rolling Right: 4: Min assist Right Sidelying to Sit: 4: Min assist Sitting - Scoot to Edge of Bed: 4: Min assist Details for Bed Mobility Assistance: VC for safe sequencing to maintain back precautions during transfer. Min assist  through trunk for support into sitting Transfers Sit to Stand: 4: Min assist Stand to Sit: 4: Min assist Details for Transfer Assistance: VC for proper sequencing and proper hand placement. Assist for controlled ascent/descent     Shoulder Instructions     Exercise     Balance     End of Session OT - End of Session Activity Tolerance: Patient limited by pain Patient left: in chair;with call bell/phone within reach;with nursing in room;with family/visitor present Nurse Communication: Mobility status  GO     Evern Bio 05/06/2012, 9:49 AM [phone]

## 2012-05-06 NOTE — Progress Notes (Signed)
Called the on call MD ( Dr. Franky Macho) about Pt's continued low BP (80's/40-50's.) Md is ok with low BP and cont to given pain med. MD informed of low output as well and the MD order a 500 cc bolus of NS. Will cont the monitor. Deliah Goody 05/06/2012 3:07 AM

## 2012-05-07 LAB — URINALYSIS, ROUTINE W REFLEX MICROSCOPIC
Bilirubin Urine: NEGATIVE
Glucose, UA: NEGATIVE mg/dL
Ketones, ur: NEGATIVE mg/dL
Leukocytes, UA: NEGATIVE
Nitrite: NEGATIVE
Protein, ur: NEGATIVE mg/dL
Specific Gravity, Urine: 1.004 — ABNORMAL LOW (ref 1.005–1.030)
Urobilinogen, UA: 0.2 mg/dL (ref 0.0–1.0)
pH: 6.5 (ref 5.0–8.0)

## 2012-05-07 LAB — URINE MICROSCOPIC-ADD ON

## 2012-05-07 MED ORDER — CYCLOBENZAPRINE HCL 10 MG PO TABS: 10.0000 mg | ORAL_TABLET | Freq: Three times a day (TID) | ORAL | Status: DC | PRN

## 2012-05-07 MED ORDER — OXYCODONE HCL 10 MG PO TABS: 10.0000 mg | ORAL_TABLET | ORAL | Status: DC | PRN

## 2012-05-07 MED FILL — Heparin Sodium (Porcine) Inj 1000 Unit/ML: INTRAMUSCULAR | Qty: 30 | Status: AC

## 2012-05-07 MED FILL — Sodium Chloride Irrigation Soln 0.9%: Qty: 3000 | Status: AC

## 2012-05-07 MED FILL — Sodium Chloride IV Soln 0.9%: INTRAVENOUS | Qty: 1000 | Status: AC

## 2012-05-07 NOTE — Progress Notes (Signed)
Judith Mcdonald, PTA [phone] 05/07/2012  

## 2012-05-07 NOTE — Progress Notes (Signed)
Physical Therapy Treatment Patient Details Name: Judith Mcdonald MRN: [id number] DOB: 1955/08/24 Today's Date: 05/07/2012 Time: 4098-1191 PT Time Calculation (min): 23 min  PT Assessment / Plan / Recommendation Comments on Treatment Session  Pt. presents to be moving well when OOB and min guard with ambulation with RW. Pt. stated that she did not have a good evening, stating that she had been in pain for most of the night. Pt. progressing with goals. Pt. trialed chair in room, thinking it would be more comfortable that recliner, but decided to reposition in recliner.  Pt. stated that she has been ambulating in hallway with husband and deferred bed mobility at this time due to pain.    Follow Up Recommendations  No PT follow up;Supervision - Intermittent     Does the patient have the potential to tolerate intense rehabilitation     Barriers to Discharge        Equipment Recommendations  Rolling walker with 5" wheels    Recommendations for Other Services    Frequency Min 5X/week   Plan      Precautions / Restrictions Precautions Precautions: Fall;Back Precaution Comments: Pt. stated 3/3 bakc precautions Required Braces or Orthoses: Spinal Brace Spinal Brace: Lumbar corset;Applied in sitting position Restrictions Weight Bearing Restrictions: No   Pertinent Vitals/Pain Patient reports of pain 7/10 in her back. BP in sitting: 105/61; patient denies of dizziness with ambulation    Mobility  Bed Mobility Bed Mobility: Not assessed Details for Bed Mobility Assistance: Pt. deferred bed mobility due to pain and stated the proper technique of getting in/OOB Transfers Transfers: Sit to Stand;Stand to Sit Sit to Stand: 4: Min guard Stand to Sit: 4: Min guard Details for Transfer Assistance: Pt. min guard with transfers for safety and balance. Pt. rated pain at 7/10 but stated she could participate with PT. Ambulation/Gait Ambulation/Gait Assistance: 4: Min guard Ambulation  Distance (Feet): 175 Feet Assistive device: Rolling walker Ambulation/Gait Assistance Details: Pt. min guard with ambulation with RW and asked SPTA to not hold onto gt. belt since it hurt her. Pt. given VC for proper hand placement on RW.  Gait Pattern: Step-through pattern;Decreased stride length Gait velocity: slow gait speed Stairs: No Wheelchair Mobility Wheelchair Mobility: No      PT Goals Acute Rehab PT Goals PT Goal Formulation: With patient/family Time For Goal Achievement: 05/13/12 Potential to Achieve Goals: Good Pt will go Supine/Side to Sit: with modified independence Pt will go Sit to Supine/Side: with modified independence Pt will go Sit to Stand: with modified independence PT Goal: Sit to Stand - Progress: Progressing toward goal Pt will go Stand to Sit: with modified independence PT Goal: Stand to Sit - Progress: Progressing toward goal Pt will Transfer Bed to Chair/Chair to Bed: with modified independence Pt will Ambulate: >150 feet;with modified independence;with least restrictive assistive device PT Goal: Ambulate - Progress: Progressing toward goal Pt will Go Up / Down Stairs: 1-2 stairs;with min assist  Visit Information  Last PT Received On: 05/07/12 Assistance Needed: +1    Subjective Data  Subjective: "Can you not touch me, that hurts" (pt. speaking of holding onto gait belt) Patient Stated Goal: to go home   Cognition  Overall Cognitive Status: Appears within functional limits for tasks assessed/performed Arousal/Alertness: Awake/alert Orientation Level: Appears intact for tasks assessed Behavior During Session: [facility] for tasks performed    Balance  Balance Balance Assessed: No  End of Session PT - End of Session Equipment Utilized During Treatment: Gait belt;Back brace  Activity Tolerance: Patient tolerated treatment well Patient left: in chair;with call bell/phone within reach;with family/visitor present (Pt's husband) Nurse Communication:  Mobility status;Patient requests pain meds    Mertie Clause, SPTA 05/07/2012, 11:09 AM

## 2012-05-07 NOTE — Progress Notes (Signed)
Occupational Therapy Treatment Patient Details Name: Judith Mcdonald MRN: [id number] DOB: [birth year] Today's Date: 05/07/2012 Time: 4098-1191 OT Time Calculation (min): 30 min  OT Assessment / Plan / Recommendation Comments on Treatment Session This 56 yo female s/p back fusion surgery presents to acute OT with all education completed. Will D/C from acute OT.    Follow Up Recommendations  No OT follow up;Supervision - Intermittent       Equipment Recommendations  Rolling walker with 5" wheels       Frequency Min 2X/week   Plan Discharge plan remains appropriate    Precautions / Restrictions Precautions Precautions: Back Precaution Booklet Issued: Yes (comment) Precaution Comments: Gave pt a handout on post op back. Pt able to state 3/3 back precautions at beginning of session Required Braces or Orthoses: Spinal Brace Spinal Brace: Applied in sitting position Restrictions Weight Bearing Restrictions: No   Pertinent Vitals/Pain 6/10    ADL  Grooming: Performed;Wash/dry hands;Supervision/safety Where Assessed - Grooming: Unsupported standing Lower Body Bathing: Simulated;Set up Where Assessed - Lower Body Bathing: Unsupported sit to stand Lower Body Dressing: Performed;Set up Where Assessed - Lower Body Dressing: Unsupported sit to stand Toilet Transfer: Performed;Supervision/safety (without use of RW to get there) Toilet Transfer Method: Sit to stand Toilet Transfer Equipment: Comfort height toilet Toileting - Clothing Manipulation and Hygiene: Performed;Modified independent Where Assessed - Toileting Clothing Manipulation and Hygiene: Sit to stand from 3-in-1 or toilet Transfers/Ambulation Related to ADLs: Pt able to doff her brace independently ADL Comments: We talked about showering at home and that she needed clearance from MD to get the incision wet and that if he did not give this to her on her D/C papers then she would need to make sure that the incision stayed dry.  Recommended a hand held shower head and using her 3n1 in shower if she needed something to sit on. Pt can cross legs today to get to her feet to doff and don socks      OT Goals ADL Goals ADL Goal: Grooming - Progress: Progressing toward goals ADL Goal: Lower Body Bathing - Progress: Progressing toward goals ADL Goal: Lower Body Dressing - Progress: Progressing toward goals ADL Goal: Toilet Transfer - Progress: Progressing toward goals ADL Goal: Toileting - Clothing Manipulation - Progress: Met ADL Goal: Toileting - Hygiene - Progress: Met Miscellaneous OT Goals OT Goal: Miscellaneous Goal #1 - Progress: Met OT Goal: Miscellaneous Goal #2 - Progress: Met  Visit Information  Last OT Received On: 05/07/12 Assistance Needed: +1    Subjective Data  Subjective: What you have shown me has helped so much      Cognition  Overall Cognitive Status: Appears within functional limits for tasks assessed/performed Arousal/Alertness: Awake/alert Orientation Level: Appears intact for tasks assessed Behavior During Session: [facility] for tasks performed    Mobility   Bed Mobility Bed Mobility: Rolling Right;Rolling Left;Sit to Sidelying Right Rolling Right: 6: Modified independent (Device/Increase time) Rolling Left: 6: Modified independent (Device/Increase time) Sit to Sidelying Right: 6: Modified independent (Device/Increase time);HOB flat Details for Bed Mobility Assistance: Pt. deferred bed mobility due to pain and stated the proper technique of getting in/OOB Transfers Transfers: Sit to Stand;Stand to Sit Sit to Stand: 5: Supervision;With upper extremity assist;From chair/3-in-1 (pushing with her hands on her thighs) Stand to Sit: 5: Supervision;With upper extremity assist;To bed (reaching back to the bed) Details for Transfer Assistance: Pt. min guard with transfers for safety and balance. Pt. rated pain at 7/10 but stated she could participate  with PT.          Balance Balance Balance  Assessed: No   End of Session OT - End of Session Equipment Utilized During Treatment: Back brace (RW intermittently) Activity Tolerance: Patient tolerated treatment well Patient left: in bed;with call bell/phone within reach;with family/visitor present (visitor)       Evette Georges [phone] 05/07/2012, 2:36 PM

## 2012-05-07 NOTE — Plan of Care (Signed)
Problem: Consults Goal: Diagnosis - Spinal Surgery Outcome: Completed/Met Date Met:  05/07/12 PLIf

## 2012-05-08 LAB — URINE CULTURE: Colony Count: NO GROWTH

## 2012-05-08 NOTE — Plan of Care (Signed)
Problem: Discharge Progression Outcomes Goal: Other Discharge Outcomes/Goals Outcome: Completed/Met Date Met:  05/08/12 Patient set up for home with necessary equipment.  Prescription called in to requested pharmacy.  Understood discharge instructions.

## 2012-05-08 NOTE — Discharge Summary (Signed)
Physician Discharge Summary  Patient ID: Judith Mcdonald MRN: [id number] DOB/AGE: [birth year] 56 y.o.  Admit date: 05/05/2012 Discharge date: 05/08/2012  Admission Diagnoses:  Discharge Diagnoses:  Active Problems:  * No active hospital problems. *    Discharged Condition: good  Hospital Course: Surgery Wednesday with PLIF. Did well. Increased activity without difficulty. Wound healing well. Pain markedly improved. Home POD 3, specific instructions given.  Consults: None  Significant Diagnostic Studies: none  Treatments: surgery: Lumbar fusion  Discharge Exam: Blood pressure 83/45, pulse 95, temperature 97.8 F (36.6 C), temperature source Oral, resp. rate 18, height 5\' 5"  (1.651 m), weight 66.225 kg (146 lb), SpO2 92.00%. Incision/Wound:clean and dry  Disposition: 01-Home or Self Care  Discharge Orders    Future Appointments: Provider: Department: Dept Phone: Center:   11/25/2012 1:00 PM [facility] [phone] [facility]   11/25/2012 2:00 PM Evern Bio, NP [facility] [phone] [facility]       Medication List     As of 05/08/2012  8:37 AM    TAKE these medications         aspirin EC 81 MG tablet   Take 81 mg by mouth daily.      CALCIUM PLUS VITAMIN D PO   Take 1 tablet by mouth daily.      cholecalciferol 1000 UNITS tablet   Commonly known as: VITAMIN D   Take 2,000 Units by mouth daily.      cyclobenzaprine 10 MG tablet   Commonly known as: FLEXERIL   Take 1 tablet (10 mg total) by mouth 3 (three) times daily as needed for muscle spasms.      esomeprazole 40 MG capsule   Commonly known as: NEXIUM   Take 40 mg by mouth daily before breakfast.      Estradiol 10 MCG Tabs   Place 1 tablet (10 mcg total) vaginally 2 (two) times a week.      VAGIFEM 10 MCG Tabs   Generic drug: Estradiol   Place 1 tablet vaginally See admin instructions. Every 3 weeks      Fish Oil 1200 MG Caps   Take 1 capsule by mouth every other day.      fluticasone 50 MCG/ACT nasal spray   Commonly known as: FLONASE   Place 1-2 sprays into the nose daily.      hydrochlorothiazide 25 MG tablet   Commonly known as: HYDRODIURIL   Take 25 mg by mouth daily.      levothyroxine 75 MCG tablet   Commonly known as: SYNTHROID, LEVOTHROID   Take 75 mcg by mouth daily.      lisinopril 10 MG tablet   Commonly known as: PRINIVIL,ZESTRIL   Take 2.5 mg by mouth every other day. Takes 1/4 tab daily      nitrofurantoin 50 MG capsule   Commonly known as: MACRODANTIN   Take 1 capsule (50 mg total) by mouth as needed.      Oxycodone HCl 10 MG Tabs   Take 1 tablet (10 mg total) by mouth every 4 (four) hours as needed.      rosuvastatin 10 MG tablet   Commonly known as: CRESTOR   Take 5 mg by mouth daily.         At home rest most of the time. Get up 9 or 10 times each day and take a 15 or 20 minute walk. No riding in the car and to your first postoperative appointment.  If you have neck surgery you may shower from the chest down starting on the third postoperative day. If you had back surgery he may start showering on the third postoperative day with saran wrap wrapped around your incisional area 3 times. After the shower remove the saran wrap. Take pain medicine as needed and other medications as instructed. Call my office for an appointment.  SignedReinaldo Meeker, MD 05/08/2012, 8:37 AM

## 2012-05-08 NOTE — Progress Notes (Signed)
Patient up to bedside chair eating breakfast.  Has brace on and back is supported with pillows.  Husband remains at bedside to assist.  Did some education regarding use of incentive spirometer to reduce possibility of pneumonia.  Also, talked about dme equipment needed for home.

## 2012-05-10 ENCOUNTER — Other Ambulatory Visit: Payer: Self-pay | Admitting: Neurosurgery

## 2012-05-10 ENCOUNTER — Ambulatory Visit
Admission: RE | Admit: 2012-05-10 | Discharge: 2012-05-10 | Disposition: A | Discharge status: Home / Self Care | Payer: PRIVATE HEALTH INSURANCE | Source: Ambulatory Visit | Attending: Neurosurgery | Admitting: Neurosurgery

## 2012-05-10 DIAGNOSIS — R509 Fever, unspecified: Secondary | ICD-10-CM

## 2012-05-11 ENCOUNTER — Encounter (HOSPITAL_COMMUNITY): Payer: Self-pay

## 2012-05-18 ENCOUNTER — Ambulatory Visit (HOSPITAL_COMMUNITY)
Admission: RE | Admit: 2012-05-18 | Discharge: 2012-05-18 | Disposition: A | Discharge status: Home / Self Care | Payer: PRIVATE HEALTH INSURANCE | Source: Ambulatory Visit | Attending: Neurosurgery | Admitting: Neurosurgery

## 2012-05-18 ENCOUNTER — Other Ambulatory Visit (HOSPITAL_COMMUNITY): Payer: Self-pay | Admitting: Neurosurgery

## 2012-05-18 DIAGNOSIS — M79609 Pain in unspecified limb: Secondary | ICD-10-CM

## 2012-05-18 DIAGNOSIS — M7989 Other specified soft tissue disorders: Secondary | ICD-10-CM

## 2012-05-18 DIAGNOSIS — M79606 Pain in leg, unspecified: Secondary | ICD-10-CM

## 2012-05-18 NOTE — Progress Notes (Signed)
VASCULAR LAB PRELIMINARY  PRELIMINARY  PRELIMINARY  PRELIMINARY  Bilateral lower extremity venous duplex completed.    Preliminary report:  Bilateral:  No evidence of DVT, superficial thrombosis, or Baker's Cyst.   Kharter Sestak, RVS 05/18/2012, 2:53 PM

## 2012-05-20 ENCOUNTER — Other Ambulatory Visit: Payer: Self-pay | Admitting: Neurosurgery

## 2012-05-20 DIAGNOSIS — M47817 Spondylosis without myelopathy or radiculopathy, lumbosacral region: Secondary | ICD-10-CM

## 2012-05-20 DIAGNOSIS — M48061 Spinal stenosis, lumbar region without neurogenic claudication: Secondary | ICD-10-CM

## 2012-05-20 DIAGNOSIS — M5137 Other intervertebral disc degeneration, lumbosacral region: Secondary | ICD-10-CM

## 2012-05-20 DIAGNOSIS — M545 Low back pain: Secondary | ICD-10-CM

## 2012-05-21 ENCOUNTER — Ambulatory Visit
Admission: RE | Admit: 2012-05-21 | Discharge: 2012-05-21 | Disposition: A | Discharge status: Home / Self Care | Payer: PRIVATE HEALTH INSURANCE | Source: Ambulatory Visit | Attending: Neurosurgery | Admitting: Neurosurgery

## 2012-05-21 DIAGNOSIS — M5137 Other intervertebral disc degeneration, lumbosacral region: Secondary | ICD-10-CM

## 2012-05-21 DIAGNOSIS — M47817 Spondylosis without myelopathy or radiculopathy, lumbosacral region: Secondary | ICD-10-CM

## 2012-05-21 DIAGNOSIS — M545 Low back pain: Secondary | ICD-10-CM

## 2012-05-21 DIAGNOSIS — M48061 Spinal stenosis, lumbar region without neurogenic claudication: Secondary | ICD-10-CM

## 2012-05-27 ENCOUNTER — Encounter (HOSPITAL_COMMUNITY): Payer: Self-pay | Admitting: Respiratory Therapy

## 2012-05-27 ENCOUNTER — Other Ambulatory Visit: Payer: Self-pay | Admitting: Neurosurgery

## 2012-05-28 ENCOUNTER — Encounter (HOSPITAL_COMMUNITY)
Admission: RE | Admit: 2012-05-28 | Discharge: 2012-05-28 | Disposition: A | Discharge status: Home / Self Care | Payer: PRIVATE HEALTH INSURANCE | Source: Ambulatory Visit | Attending: Neurosurgery | Admitting: Neurosurgery

## 2012-05-28 ENCOUNTER — Encounter (HOSPITAL_COMMUNITY): Payer: Self-pay

## 2012-05-28 HISTORY — DX: Adverse effect of unspecified anesthetic, initial encounter: T41.45XA

## 2012-05-28 HISTORY — DX: Constipation, unspecified: K59.00

## 2012-05-28 HISTORY — DX: Unspecified osteoarthritis, unspecified site: M19.90

## 2012-05-28 HISTORY — DX: Other complications of anesthesia, initial encounter: T88.59XA

## 2012-05-28 LAB — CBC
MCH: 30.1 pg (ref 26.0–34.0)
MCHC: 33 g/dL (ref 30.0–36.0)
Platelets: 530 10*3/uL — ABNORMAL HIGH (ref 150–400)

## 2012-05-28 LAB — BASIC METABOLIC PANEL
BUN: 16 mg/dL (ref 6–23)
Calcium: 9.5 mg/dL (ref 8.4–10.5)
GFR calc non Af Amer: 79 mL/min — ABNORMAL LOW (ref >90)
Glucose, Bld: 92 mg/dL (ref 70–99)

## 2012-05-28 LAB — SURGICAL PCR SCREEN: MRSA, PCR: NEGATIVE

## 2012-05-28 NOTE — Pre-Procedure Instructions (Signed)
20 Judith Mcdonald  05/28/2012   Your procedure is scheduled on:  Monday May 31, 2012  Report to [facility] Stay [facility] at 5:30PM.  Call this number if you have problems the morning of surgery: [phone]   Remember:   Do not eat food or drink After Midnight.    Take these medicines the morning of surgery with A SIP OF WATER: nexium, flonase, gabapentin, synthroid, macrodantin, oxycodone   Do not wear jewelry, make-up or nail polish.  Do not wear lotions, powders, or perfumes.  Do not shave 48 hours prior to surgery.  Do not bring valuables to the hospital.  Contacts, dentures or bridgework may not be worn into surgery.  Leave suitcase in the car. After surgery it may be brought to your room.  For patients admitted to the hospital, checkout time is 11:00 AM the day of discharge.   Patients discharged the day of surgery will not be allowed to drive home.  Name and phone number of your driver: family / friend  Special Instructions: Shower using CHG 2 nights before surgery and the night before surgery.  If you shower the day of surgery use CHG.  Use special wash - you have one bottle of CHG for all showers.  You should use approximately 1/3 of the bottle for each shower.   Please read over the following fact sheets that you were given: Pain Booklet, Coughing and Deep Breathing, MRSA Information and Surgical Site Infection Prevention

## 2012-05-28 NOTE — Progress Notes (Signed)
Spoke with Dr. Venetia Maxon, patient able to eat and drink until 11:00am on 05/31/12,

## 2012-05-28 NOTE — Progress Notes (Signed)
Reviewed CXR from 05/10/12 with Dr. Gelene Mink.  No orders given,

## 2012-05-28 NOTE — Progress Notes (Signed)
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  Discussed with Revonda Standard, [place]. Recommend patient discuss with anesthesiologist day of surgery. Notified patient.

## 2012-05-31 ENCOUNTER — Inpatient Hospital Stay (HOSPITAL_COMMUNITY): Payer: PRIVATE HEALTH INSURANCE

## 2012-05-31 ENCOUNTER — Inpatient Hospital Stay (HOSPITAL_COMMUNITY)
Admission: RE | Admit: 2012-05-31 | Discharge: 2012-06-01 | DRG: 517 | Disposition: A | Discharge status: Home / Self Care | Payer: PRIVATE HEALTH INSURANCE | Source: Ambulatory Visit | Attending: Neurosurgery | Admitting: Neurosurgery

## 2012-05-31 ENCOUNTER — Encounter (HOSPITAL_COMMUNITY): Payer: Self-pay | Admitting: Vascular Surgery

## 2012-05-31 ENCOUNTER — Inpatient Hospital Stay (HOSPITAL_COMMUNITY): Payer: PRIVATE HEALTH INSURANCE | Admitting: Anesthesiology

## 2012-05-31 ENCOUNTER — Encounter (HOSPITAL_COMMUNITY): Payer: Self-pay | Admitting: *Deleted

## 2012-05-31 ENCOUNTER — Encounter (HOSPITAL_COMMUNITY)
Admission: RE | Disposition: A | Discharge status: Home / Self Care | Payer: Self-pay | Source: Ambulatory Visit | Attending: Neurosurgery

## 2012-05-31 DIAGNOSIS — IMO0002 Reserved for concepts with insufficient information to code with codable children: Secondary | ICD-10-CM | POA: Diagnosis present

## 2012-05-31 DIAGNOSIS — Y831 Surgical operation with implant of artificial internal device as the cause of abnormal reaction of the patient, or of later complication, without mention of misadventure at the time of the procedure: Secondary | ICD-10-CM | POA: Diagnosis present

## 2012-05-31 DIAGNOSIS — Z79899 Other long term (current) drug therapy: Secondary | ICD-10-CM

## 2012-05-31 DIAGNOSIS — Z01812 Encounter for preprocedural laboratory examination: Secondary | ICD-10-CM

## 2012-05-31 DIAGNOSIS — E039 Hypothyroidism, unspecified: Secondary | ICD-10-CM | POA: Diagnosis present

## 2012-05-31 DIAGNOSIS — I10 Essential (primary) hypertension: Secondary | ICD-10-CM | POA: Diagnosis present

## 2012-05-31 DIAGNOSIS — Z7982 Long term (current) use of aspirin: Secondary | ICD-10-CM

## 2012-05-31 DIAGNOSIS — K219 Gastro-esophageal reflux disease without esophagitis: Secondary | ICD-10-CM | POA: Diagnosis present

## 2012-05-31 DIAGNOSIS — E7800 Pure hypercholesterolemia, unspecified: Secondary | ICD-10-CM | POA: Diagnosis present

## 2012-05-31 DIAGNOSIS — T84498A Other mechanical complication of other internal orthopedic devices, implants and grafts, initial encounter: Principal | ICD-10-CM | POA: Diagnosis present

## 2012-05-31 HISTORY — PX: HARDWARE REMOVAL: SHX979

## 2012-05-31 SURGERY — REMOVAL, HARDWARE
Anesthesia: General | Site: Back | Wound class: Clean

## 2012-05-31 MED ORDER — HYDROMORPHONE HCL PF 1 MG/ML IJ SOLN
0.2500 mg | INTRAMUSCULAR | Status: DC | PRN
  Administered 2012-05-31 (×4): 0.5 mg via INTRAVENOUS

## 2012-05-31 MED ORDER — SODIUM CHLORIDE 0.9 % IJ SOLN: 3.0000 mL | INTRAMUSCULAR | Status: DC | PRN

## 2012-05-31 MED ORDER — MIDAZOLAM HCL 2 MG/2ML IJ SOLN: 1.0000 mg | INTRAMUSCULAR | Status: DC | PRN

## 2012-05-31 MED ORDER — ATORVASTATIN CALCIUM 10 MG PO TABS
10.0000 mg | ORAL_TABLET | Freq: Every day | ORAL | Status: DC
  Administered 2012-06-01: 10 mg via ORAL
  Filled 2012-05-31: qty 1

## 2012-05-31 MED ORDER — ONDANSETRON HCL 4 MG/2ML IJ SOLN
INTRAMUSCULAR | Status: DC | PRN
  Administered 2012-05-31: 4 mg via INTRAVENOUS

## 2012-05-31 MED ORDER — THROMBIN 5000 UNITS EX KIT
PACK | CUTANEOUS | Status: DC | PRN
  Administered 2012-05-31 (×2): 5000 [IU] via TOPICAL

## 2012-05-31 MED ORDER — ACETAMINOPHEN 10 MG/ML IV SOLN
1000.0000 mg | Freq: Four times a day (QID) | INTRAVENOUS | Status: AC
  Administered 2012-06-01 (×4): 1000 mg via INTRAVENOUS
  Filled 2012-05-31 (×5): qty 100

## 2012-05-31 MED ORDER — HEMOSTATIC AGENTS (NO CHARGE) OPTIME
TOPICAL | Status: DC | PRN
  Administered 2012-05-31: 1 via TOPICAL

## 2012-05-31 MED ORDER — LIDOCAINE HCL (CARDIAC) 20 MG/ML IV SOLN
INTRAVENOUS | Status: DC | PRN
  Administered 2012-05-31: 100 mg via INTRAVENOUS

## 2012-05-31 MED ORDER — OXYCODONE HCL 5 MG/5ML PO SOLN: 5.0000 mg | Freq: Once | ORAL | Status: DC | PRN

## 2012-05-31 MED ORDER — CYCLOBENZAPRINE HCL 10 MG PO TABS: 10.0000 mg | ORAL_TABLET | Freq: Three times a day (TID) | ORAL | Status: DC | PRN

## 2012-05-31 MED ORDER — PROPOFOL 10 MG/ML IV BOLUS
INTRAVENOUS | Status: DC | PRN
  Administered 2012-05-31: 180 mg via INTRAVENOUS

## 2012-05-31 MED ORDER — SENNOSIDES-DOCUSATE SODIUM 8.6-50 MG PO TABS
1.0000 | ORAL_TABLET | Freq: Every day | ORAL | Status: DC
  Administered 2012-06-01: 1 via ORAL
  Filled 2012-05-31: qty 1

## 2012-05-31 MED ORDER — 0.9 % SODIUM CHLORIDE (POUR BTL) OPTIME
TOPICAL | Status: DC | PRN
  Administered 2012-05-31: 1000 mL

## 2012-05-31 MED ORDER — ASPIRIN EC 81 MG PO TBEC
81.0000 mg | DELAYED_RELEASE_TABLET | Freq: Every day | ORAL | Status: DC
  Administered 2012-06-01: 81 mg via ORAL
  Filled 2012-05-31: qty 1

## 2012-05-31 MED ORDER — ROCURONIUM BROMIDE 100 MG/10ML IV SOLN
INTRAVENOUS | Status: DC | PRN
  Administered 2012-05-31: 40 mg via INTRAVENOUS

## 2012-05-31 MED ORDER — ACETAMINOPHEN 10 MG/ML IV SOLN
INTRAVENOUS | Status: AC
  Administered 2012-05-31: 1000 mg via INTRAVENOUS
  Filled 2012-05-31: qty 100

## 2012-05-31 MED ORDER — FENTANYL CITRATE 0.05 MG/ML IJ SOLN
INTRAMUSCULAR | Status: DC | PRN
  Administered 2012-05-31: 200 ug via INTRAVENOUS
  Administered 2012-05-31: 50 ug via INTRAVENOUS

## 2012-05-31 MED ORDER — CEFAZOLIN SODIUM-DEXTROSE 2-3 GM-% IV SOLR
INTRAVENOUS | Status: AC
  Administered 2012-05-31: 2 g via INTRAVENOUS
  Filled 2012-05-31: qty 50

## 2012-05-31 MED ORDER — HYDROMORPHONE HCL PF 1 MG/ML IJ SOLN
0.5000 mg | INTRAMUSCULAR | Status: DC | PRN
  Administered 2012-05-31 – 2012-06-01 (×3): 1 mg via INTRAVENOUS
  Filled 2012-05-31 (×3): qty 1

## 2012-05-31 MED ORDER — ARTIFICIAL TEARS OP OINT
TOPICAL_OINTMENT | OPHTHALMIC | Status: DC | PRN
  Administered 2012-05-31: 1 via OPHTHALMIC

## 2012-05-31 MED ORDER — LEVOTHYROXINE SODIUM 75 MCG PO TABS
75.0000 ug | ORAL_TABLET | Freq: Every day | ORAL | Status: DC
  Administered 2012-06-01: 75 ug via ORAL
  Filled 2012-05-31 (×3): qty 1

## 2012-05-31 MED ORDER — SODIUM CHLORIDE 0.9 % IV SOLN: 250.0000 mL | INTRAVENOUS | Status: DC

## 2012-05-31 MED ORDER — PHENOL 1.4 % MT LIQD: 1.0000 | OROMUCOSAL | Status: DC | PRN

## 2012-05-31 MED ORDER — LACTATED RINGERS IV SOLN
INTRAVENOUS | Status: DC | PRN
  Administered 2012-05-31 (×2): via INTRAVENOUS

## 2012-05-31 MED ORDER — ESTRADIOL 10 MCG VA TABS: 1.0000 | ORAL_TABLET | VAGINAL | Status: DC

## 2012-05-31 MED ORDER — GLYCOPYRROLATE 0.2 MG/ML IJ SOLN
INTRAMUSCULAR | Status: DC | PRN
  Administered 2012-05-31: .5 mg via INTRAVENOUS

## 2012-05-31 MED ORDER — VITAMIN D3 25 MCG (1000 UNIT) PO TABS
1000.0000 [IU] | ORAL_TABLET | Freq: Every day | ORAL | Status: DC
  Administered 2012-06-01: 1000 [IU] via ORAL
  Filled 2012-05-31: qty 1

## 2012-05-31 MED ORDER — SODIUM CHLORIDE 0.9 % IR SOLN
Status: DC | PRN
  Administered 2012-05-31: 18:00:00

## 2012-05-31 MED ORDER — ONDANSETRON HCL 4 MG/2ML IJ SOLN: 4.0000 mg | INTRAMUSCULAR | Status: DC | PRN

## 2012-05-31 MED ORDER — HYDROCHLOROTHIAZIDE 25 MG PO TABS
25.0000 mg | ORAL_TABLET | Freq: Every day | ORAL | Status: DC
  Filled 2012-05-31: qty 1

## 2012-05-31 MED ORDER — OXYCODONE HCL 5 MG PO TABS: 5.0000 mg | ORAL_TABLET | Freq: Once | ORAL | Status: DC | PRN

## 2012-05-31 MED ORDER — HYDROMORPHONE HCL PF 1 MG/ML IJ SOLN
INTRAMUSCULAR | Status: AC
  Administered 2012-05-31: 0.5 mg via INTRAVENOUS
  Filled 2012-05-31: qty 1

## 2012-05-31 MED ORDER — CYCLOBENZAPRINE HCL 10 MG PO TABS
10.0000 mg | ORAL_TABLET | Freq: Three times a day (TID) | ORAL | Status: DC | PRN
  Filled 2012-05-31 (×2): qty 1

## 2012-05-31 MED ORDER — LISINOPRIL 2.5 MG PO TABS
2.5000 mg | ORAL_TABLET | ORAL | Status: DC
  Filled 2012-05-31: qty 1

## 2012-05-31 MED ORDER — FENTANYL CITRATE 0.05 MG/ML IJ SOLN: 50.0000 ug | Freq: Once | INTRAMUSCULAR | Status: DC

## 2012-05-31 MED ORDER — PROMETHAZINE HCL 25 MG/ML IJ SOLN: 6.2500 mg | INTRAMUSCULAR | Status: DC | PRN

## 2012-05-31 MED ORDER — ACETAMINOPHEN 650 MG RE SUPP: 650.0000 mg | RECTAL | Status: DC | PRN

## 2012-05-31 MED ORDER — PANTOPRAZOLE SODIUM 40 MG PO TBEC
40.0000 mg | DELAYED_RELEASE_TABLET | Freq: Every day | ORAL | Status: DC
  Filled 2012-05-31: qty 1

## 2012-05-31 MED ORDER — SODIUM CHLORIDE 0.9 % IJ SOLN: 3.0000 mL | Freq: Two times a day (BID) | INTRAMUSCULAR | Status: DC

## 2012-05-31 MED ORDER — FLUTICASONE PROPIONATE 50 MCG/ACT NA SUSP
1.0000 | Freq: Every day | NASAL | Status: DC
  Filled 2012-05-31: qty 16

## 2012-05-31 MED ORDER — CEFAZOLIN SODIUM 1-5 GM-% IV SOLN
1.0000 g | Freq: Three times a day (TID) | INTRAVENOUS | Status: AC
  Administered 2012-05-31 – 2012-06-01 (×2): 1 g via INTRAVENOUS
  Filled 2012-05-31 (×2): qty 50

## 2012-05-31 MED ORDER — MIDAZOLAM HCL 5 MG/5ML IJ SOLN
INTRAMUSCULAR | Status: DC | PRN
  Administered 2012-05-31: 2 mg via INTRAVENOUS

## 2012-05-31 MED ORDER — MORPHINE SULFATE 10 MG/ML IJ SOLN
INTRAMUSCULAR | Status: DC | PRN
  Administered 2012-05-31: 2 mg via INTRAVENOUS
  Administered 2012-05-31 (×2): 4 mg via INTRAVENOUS

## 2012-05-31 MED ORDER — BACITRACIN 50000 UNITS IM SOLR
INTRAMUSCULAR | Status: AC
  Filled 2012-05-31: qty 1

## 2012-05-31 MED ORDER — EPHEDRINE SULFATE 50 MG/ML IJ SOLN
INTRAMUSCULAR | Status: DC | PRN
  Administered 2012-05-31: 5 mg via INTRAVENOUS

## 2012-05-31 MED ORDER — OXYCODONE HCL 5 MG PO TABS
10.0000 mg | ORAL_TABLET | ORAL | Status: DC | PRN
  Administered 2012-06-01 (×4): 10 mg via ORAL
  Filled 2012-05-31 (×4): qty 2

## 2012-05-31 MED ORDER — GABAPENTIN 300 MG PO CAPS
300.0000 mg | ORAL_CAPSULE | Freq: Every day | ORAL | Status: DC
  Administered 2012-05-31: 300 mg via ORAL
  Filled 2012-05-31 (×2): qty 1

## 2012-05-31 MED ORDER — MENTHOL 3 MG MT LOZG: 1.0000 | LOZENGE | OROMUCOSAL | Status: DC | PRN

## 2012-05-31 MED ORDER — ACETAMINOPHEN 325 MG PO TABS: 650.0000 mg | ORAL_TABLET | ORAL | Status: DC | PRN

## 2012-05-31 MED ORDER — PHENYLEPHRINE HCL 10 MG/ML IJ SOLN
INTRAMUSCULAR | Status: DC | PRN
  Administered 2012-05-31 (×10): 40 ug via INTRAVENOUS

## 2012-05-31 MED ORDER — NEOSTIGMINE METHYLSULFATE 1 MG/ML IJ SOLN
INTRAMUSCULAR | Status: DC | PRN
  Administered 2012-05-31: 4 mg via INTRAVENOUS

## 2012-05-31 MED ORDER — SODIUM CHLORIDE 0.9 % IV SOLN
INTRAVENOUS | Status: AC
  Filled 2012-05-31: qty 500

## 2012-05-31 SURGICAL SUPPLY — 62 items
ADH SKN CLS APL DERMABOND .7 (GAUZE/BANDAGES/DRESSINGS) ×1
APL SKNCLS STERI-STRIP NONHPOA (GAUZE/BANDAGES/DRESSINGS) ×1
BAG DECANTER FOR FLEXI CONT (MISCELLANEOUS) ×2 IMPLANT
BENZOIN TINCTURE PRP APPL 2/3 (GAUZE/BANDAGES/DRESSINGS) ×2 IMPLANT
BLADE SURG ROTATE 9660 (MISCELLANEOUS) IMPLANT
BRUSH SCRUB EZ PLAIN DRY (MISCELLANEOUS) ×2 IMPLANT
BUR MATCHSTICK NEURO 3.0 LAGG (BURR) ×2 IMPLANT
BUR PRECISION FLUTE 6.0 (BURR) IMPLANT
CANISTER SUCTION 2500CC (MISCELLANEOUS) IMPLANT
CAP LOCKING REVERE (Cap) ×3 IMPLANT
CLOTH BEACON ORANGE TIMEOUT ST (SAFETY) ×2 IMPLANT
CONT SPEC 4OZ CLIKSEAL STRL BL (MISCELLANEOUS) ×2 IMPLANT
COVER TABLE BACK 60X90 (DRAPES) ×1 IMPLANT
DECANTER SPIKE VIAL GLASS SM (MISCELLANEOUS) ×2 IMPLANT
DERMABOND ADVANCED (GAUZE/BANDAGES/DRESSINGS) ×1
DERMABOND ADVANCED .7 DNX12 (GAUZE/BANDAGES/DRESSINGS) ×1 IMPLANT
DRAPE C-ARM 42X72 X-RAY (DRAPES) ×2 IMPLANT
DRAPE LAPAROTOMY 100X72X124 (DRAPES) ×2 IMPLANT
DRAPE POUCH INSTRU U-SHP 10X18 (DRAPES) ×2 IMPLANT
DRAPE PROXIMA HALF (DRAPES) IMPLANT
DRAPE SURG 17X23 STRL (DRAPES) ×2 IMPLANT
DRSG OPSITE 4X5.5 SM (GAUZE/BANDAGES/DRESSINGS) ×2 IMPLANT
ELECT REM PT RETURN 9FT ADLT (ELECTROSURGICAL) ×2
ELECTRODE REM PT RTRN 9FT ADLT (ELECTROSURGICAL) ×1 IMPLANT
EVACUATOR 3/16  PVC DRAIN (DRAIN) ×1
EVACUATOR 3/16 PVC DRAIN (DRAIN) IMPLANT
GAUZE SPONGE 4X4 16PLY XRAY LF (GAUZE/BANDAGES/DRESSINGS) IMPLANT
GLOVE BIO SURGEON STRL SZ 6.5 (GLOVE) ×4 IMPLANT
GLOVE BIO SURGEON STRL SZ8 (GLOVE) ×2 IMPLANT
GLOVE BIOGEL PI IND STRL 6.5 (GLOVE) ×1 IMPLANT
GLOVE BIOGEL PI IND STRL 7.0 (GLOVE) IMPLANT
GLOVE BIOGEL PI INDICATOR 6.5 (GLOVE) ×1
GLOVE BIOGEL PI INDICATOR 7.0 (GLOVE) ×1
GLOVE EXAM NITRILE LRG STRL (GLOVE) IMPLANT
GLOVE EXAM NITRILE MD LF STRL (GLOVE) IMPLANT
GLOVE EXAM NITRILE XL STR (GLOVE) IMPLANT
GLOVE EXAM NITRILE XS STR PU (GLOVE) IMPLANT
GLOVE INDICATOR 8.5 STRL (GLOVE) ×2 IMPLANT
GLOVE SURG SS PI 7.0 STRL IVOR (GLOVE) ×2 IMPLANT
GOWN BRE IMP SLV AUR LG STRL (GOWN DISPOSABLE) ×2 IMPLANT
GOWN BRE IMP SLV AUR XL STRL (GOWN DISPOSABLE) ×5 IMPLANT
GOWN STRL REIN 2XL LVL4 (GOWN DISPOSABLE) IMPLANT
KIT BASIN OR (CUSTOM PROCEDURE TRAY) ×2 IMPLANT
KIT ROOM TURNOVER OR (KITS) ×2 IMPLANT
NDL SPNL 22GX3.5 QUINCKE BK (NEEDLE) ×1 IMPLANT
NEEDLE HYPO 22GX1.5 SAFETY (NEEDLE) ×2 IMPLANT
NEEDLE SPNL 22GX3.5 QUINCKE BK (NEEDLE) ×2 IMPLANT
NS IRRIG 1000ML POUR BTL (IV SOLUTION) ×2 IMPLANT
PACK LAMINECTOMY NEURO (CUSTOM PROCEDURE TRAY) ×2 IMPLANT
SCREW PEDICLE 5.5MMX40MM (Screw) ×1 IMPLANT
SPONGE GAUZE 4X4 12PLY (GAUZE/BANDAGES/DRESSINGS) ×2 IMPLANT
SPONGE SURGIFOAM ABS GEL SZ50 (HEMOSTASIS) ×2 IMPLANT
STRIP CLOSURE SKIN 1/2X4 (GAUZE/BANDAGES/DRESSINGS) ×2 IMPLANT
SUT VIC AB 0 CT1 18XCR BRD8 (SUTURE) ×1 IMPLANT
SUT VIC AB 0 CT1 8-18 (SUTURE) ×2
SUT VIC AB 2-0 CT1 18 (SUTURE) ×3 IMPLANT
SUT VICRYL 4-0 PS2 18IN ABS (SUTURE) ×2 IMPLANT
SYR 20ML ECCENTRIC (SYRINGE) ×2 IMPLANT
TAPE STRIPS DRAPE STRL (GAUZE/BANDAGES/DRESSINGS) ×1 IMPLANT
TOWEL OR 17X24 6PK STRL BLUE (TOWEL DISPOSABLE) ×2 IMPLANT
TOWEL OR 17X26 10 PK STRL BLUE (TOWEL DISPOSABLE) ×2 IMPLANT
WATER STERILE IRR 1000ML POUR (IV SOLUTION) ×2 IMPLANT

## 2012-05-31 NOTE — Transfer of Care (Signed)
Immediate Anesthesia Transfer of Care Note  Patient: Judith Mcdonald  Procedure(s) Performed: Procedure(s) (LRB) with comments: HARDWARE REMOVAL (N/A) - Exploration of Lumbar Fusion Lumbar Four to Sacral One   Patient Location: PACU  Anesthesia Type:General  Level of Consciousness: sedated  Airway & Oxygen Therapy: Patient Spontanous Breathing and Patient connected to face mask oxygen  Post-op Assessment: Report given to PACU RN and Post -op Vital signs reviewed and stable  Post vital signs: Reviewed and stable  Complications: No apparent anesthesia complications

## 2012-05-31 NOTE — Progress Notes (Signed)
Dr. Cram visited patient at bedside  

## 2012-05-31 NOTE — Anesthesia Preprocedure Evaluation (Addendum)
Anesthesia Evaluation  Patient identified by MRN, date of birth, ID band Patient awake  General Assessment Comment:Pt reports right upper lip "still slightly numb" after intubation from previous surgery 3 weeks ago.  Reviewed: Allergy & Precautions, H&P , NPO status , Patient's Chart, lab work & pertinent test results, reviewed documented beta blocker date and time   Airway Mallampati: I TM Distance: >3 FB Neck ROM: Full    Dental  (+) Dental Advisory Given and Teeth Intact   Pulmonary  breath sounds clear to auscultation        Cardiovascular hypertension, Pt. on medications + Peripheral Vascular Disease Rhythm:Regular Rate:Normal     Neuro/Psych  Headaches,    GI/Hepatic GERD-  Medicated and Controlled,  Endo/Other  Hypothyroidism   Renal/GU      Musculoskeletal   Abdominal (+)  Abdomen: soft. Bowel sounds: normal.  Peds  Hematology   Anesthesia Other Findings Anemic, hypokalemic  Reproductive/Obstetrics                          Anesthesia Physical Anesthesia Plan  ASA: II  Anesthesia Plan: General   Post-op Pain Management:    Induction: Intravenous  Airway Management Planned: Oral ETT  Additional Equipment:   Intra-op Plan:   Post-operative Plan: Extubation in OR  Informed Consent: I have reviewed the patients History and Physical, chart, labs and discussed the procedure including the risks, benefits and alternatives for the proposed anesthesia with the patient or authorized representative who has indicated his/her understanding and acceptance.   Dental advisory given  Plan Discussed with: CRNA, Surgeon and Anesthesiologist  Anesthesia Plan Comments:        Anesthesia Quick Evaluation

## 2012-05-31 NOTE — Anesthesia Postprocedure Evaluation (Signed)
Anesthesia Post Note  Patient: Judith Mcdonald  Procedure(s) Performed: Procedure(s) (LRB): HARDWARE REMOVAL (N/A)  Anesthesia type: general  Patient location: PACU  Post pain: Pain level controlled  Post assessment: Patient's Cardiovascular Status Stable  Last Vitals:  Filed Vitals:   05/31/12 2100  BP:   Pulse: 56  Temp:   Resp: 20    Post vital signs: Reviewed and stable  Level of consciousness: sedated  Complications: No apparent anesthesia complications

## 2012-05-31 NOTE — H&P (Signed)
Judith Mcdonald is an 56 y.o. female.   Chief Complaint: Back and left leg pain HPI: Patient is a very pleasant 56 year old female who about 3-4 weeks ago underwent an L4-S1 fusion postoperatively patient did very well initially however started developing left leg pain starting predominately below her knee involving the anterior margins of her shin and outside of her shin occasionally the top of her foot. Workup consisted of first and an ultrasound which was negative for DVT and subsequent CT of her back which showed that the left L5 screw had migrated outside the vertebral body on of L5 on the left. Due to her persistence of left leg pain and the malposition of the left L5 screw she was recommended repositioning of her left L5 screw. I have extensively explained the risks and benefits of that operation as well as perioperative course and expectations of outcome alternatives of surgery and she understands and agrees to proceed forward.  Past Medical History  Diagnosis Date  . H/O typhus     at age 55 yrs  . Hx: UTI (urinary tract infection)   . Hx of menorrhagia   . Irregular menses   . Hypothyroidism   . Hx of migraines     with menses  . Hx of amenorrhea   . History of recurrent UTIs   . Mastodynia   . Hypercholesteremia   . Fibromuscular dysplasia   . GERD (gastroesophageal reflux disease)   . 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 Normal cardiac stress test 2013    Dr Donnie Aho  . Complication of anesthesia     Patient has fibromuscular dyplasia of carotid arteries,"neck positioning is important"  . Hypertension     Dr. Clelia Croft, [place]  . Arthritis   . Constipation     Past Surgical History  Procedure Date  . Cesarean section     2x in 1984 & 1986  . Nasal septum surgery   . Cardiac catheterization [address], [place]  . Cardiovascular stress test     done at Dr. York Spaniel office  . Back surgery     May 05, 2012    Family History  Problem  Relation Age of Onset  . Hypertension Father   . Cancer Father   . Heart disease Maternal Grandmother    Social History:  reports that she has never smoked. She has never used smokeless tobacco. She reports that she drinks alcohol. She reports that she does not use illicit drugs.  Allergies: No Known Allergies  Medications Prior to Admission  Medication Sig Dispense Refill  . aspirin EC 81 MG tablet Take 81 mg by mouth daily.      . Calcium Carbonate-Vitamin D (CALCIUM PLUS VITAMIN D PO) Take 1 tablet by mouth daily.      . cholecalciferol (VITAMIN D) 1000 UNITS tablet Take 1,000 Units by mouth daily.       . cyclobenzaprine (FLEXERIL) 10 MG tablet Take 1 tablet (10 mg total) by mouth 3 (three) times daily as needed for muscle spasms.  80 tablet  1  . esomeprazole (NEXIUM) 40 MG capsule Take 40 mg by mouth daily before breakfast.      . Estradiol (VAGIFEM) 10 MCG TABS Place 1 tablet vaginally every 30 (thirty) days. Every 3 weeks      . fluticasone (FLONASE) 50 MCG/ACT nasal spray Place 1-2 sprays into the nose daily.      Marland Kitchen gabapentin (NEURONTIN) 300  MG capsule Take 300 mg by mouth at bedtime.      . hydrochlorothiazide (HYDRODIURIL) 25 MG tablet Take 25 mg by mouth daily.      Marland Kitchen levothyroxine (SYNTHROID, LEVOTHROID) 75 MCG tablet Take 75 mcg by mouth daily.      Marland Kitchen lisinopril (PRINIVIL,ZESTRIL) 10 MG tablet Take 2.5 mg by mouth every other day. Takes 1/4 tab daily      . oxyCODONE 10 MG TABS Take 1 tablet (10 mg total) by mouth every 4 (four) hours as needed.  80 tablet  0  . rosuvastatin (CRESTOR) 10 MG tablet Take 5 mg by mouth daily.       Marland Kitchen senna-docusate (SENOKOT-S) 8.6-50 MG per tablet Take 1 tablet by mouth daily.      . nitrofurantoin (MACRODANTIN) 50 MG capsule Take 1 capsule (50 mg total) by mouth as needed.  1 capsule  12    No results found for this or any previous visit (from the past 48 hour(s)). No results found.  Review of Systems  Constitutional: Negative.   HENT:  Positive for neck pain.   Eyes: Negative.   Respiratory: Negative.   Cardiovascular: Negative.   Gastrointestinal: Negative.   Genitourinary: Negative.   Musculoskeletal: Positive for myalgias and back pain.  Skin: Negative.   Neurological: Positive for sensory change.  Endo/Heme/Allergies: Negative.   Psychiatric/Behavioral: Negative.     There were no vitals taken for this visit. Physical Exam  Constitutional: She is oriented to person, place, and time. She appears well-developed and well-nourished.  Eyes: Pupils are equal, round, and reactive to light.  Cardiovascular: Normal rate.   Respiratory: Effort normal.  GI: Soft. Bowel sounds are normal.  Neurological: She is alert and oriented to person, place, and time.     Assessment/Plan 56 year old female presents for a revision of her L4-S1 hardware on the left.  Judith Mcdonald P 05/31/2012, 4:59 PM

## 2012-05-31 NOTE — Progress Notes (Signed)
Report given to [facility] [id number]

## 2012-05-31 NOTE — Preoperative (Signed)
Beta Blockers   Reason not to administer Beta Blockers:Not Applicable 

## 2012-05-31 NOTE — Op Note (Signed)
Preoperative diagnosis: Left L5 radiculopathy and malpositioned left L5 screw  Postoperative diagnosis: Left L5 radiculopathy and malpositioned left L5 screw and displaced interbody cage  Procedure: Reexploration of fusion removal of hardware on the left with exploration of the L4-L5 and S1 screws and reexploration of the left L5 neural foramen  Surgeon: Jillyn Hidden Thaddus Mcdowell  Assistant: Shirlean Kelly  Anesthesia: Gen.  EBL: Minimal  History of present illness: Patient is a very pleasant 56 year old female who 4 weeks ago underwent an L4-S1 fusion initially did very well over last couple weeks has had progressive worsening left leg pain initially this was worked up with a vascular duplex Doppler to rule out DVT subsequent CT scan showed on a left L5 screw that migrated outside of vertebral body and a cage to have potentially shifted laterally. So patient was recommended repositioning of her screw and exploration of her L5 foramen risks and benefits of the operation as well as perioperative course and expectations of outcome alternatives of surgery were all spine the patient she understood and agreed to proceed forward.  Operative procedure: Patient brought into the or was induced under general anesthesia positioned prone the Wilson frame her back was prepped and draped in routine sterile fashion her old incision was opened up and there was a mild amount of fibrosis at Re: begun this is all teased away the sutures were cut and removed and the plane was then developed back down to the hardware and the epidural space. Across it was immediate identified and dissected free and removed the rod was removed from the left from L4 down to S1 after exposure of the thecal sac and the L4-S1 hardware on the left the all screws were inspected the L4-S1 screws were solid the L5 screws loose this was removed at this point as the epidural space was easily identified decide explored the L5 foramen teased with Gelfoam explored  the L5 foramen and it appeared that the interbody cage had displaced slightly laterally in the sitting up underneath the L5 nerve root was not causing compression the L5 nerve root however was in close proximity so because of the potential displacement and movement here I elected to collapse the cage and advance it based on preoperative imaging have some room to advance it so under fluoroscopy we advanced the cage and I reexpanded it. Clearly this is no longer causing any stenosis on the L5 nerve the L5 foramen was further extended I removed some further ligament explored the L5 nerve root was now widely decompressed usually excepting a coronary drawer coronary dilator and hockey-stick out the foramen then I went to reposition the screw season and drill I drilled under power hole starting the same entry point but directed more medially cannulated with the awl probed With a 4.5 tap and then place a 5.5 x 40 screw inserted in their player. At a much greater medial trajectory and fluoroscopy confirmed good position of all the implants and the screws so than the rod was repositioned the L5 screws compressed against S1 all the screws were tightened down in situ the crossing was reapplied the wound scope to irrigate fixing space was maintained Gelfoam was laid up the dura a large director was placed and was closed in layers with interrupted Vicryl and the skin was closed running 4 septic or benzoin and Steri-Strips were applied patient recovered in stable condition. At the end of case on it counts and sponge counts were correct.

## 2012-06-01 ENCOUNTER — Encounter (HOSPITAL_COMMUNITY): Payer: Self-pay | Admitting: Neurosurgery

## 2012-06-01 MED ORDER — OXYCODONE HCL 10 MG PO TABS: 10.0000 mg | ORAL_TABLET | ORAL | Status: DC | PRN

## 2012-06-01 MED ORDER — DEXAMETHASONE SODIUM PHOSPHATE 10 MG/ML IJ SOLN
10.0000 mg | Freq: Four times a day (QID) | INTRAMUSCULAR | Status: AC
  Administered 2012-06-01 (×2): 10 mg via INTRAVENOUS
  Filled 2012-06-01 (×2): qty 1

## 2012-06-01 NOTE — Evaluation (Signed)
Physical Therapy Evaluation Patient Details Name: Judith Mcdonald MRN: [id number] DOB: January 07, 1956 Today's Date: 06/01/2012 Time: 0912-     PT Assessment / Plan / Recommendation Clinical Impression  Pt is a 56 y.o. female s/p hardware removal.  Patient reports pain in LLE and pain at surgical site.  Patient tolerates activity well and is at supervision for all tasks.  Educated patient on importance of precautions as well as reassurane for current level of compliance.  Patient has modest deficits in activity tolerance secondaryt to pain medication and pain; will benefit from one more session of PT to reinforce functional mobility and maximize independence for discharge.  Encouraged patient to ambulate with nsg this evening.    PT Assessment  Patient needs continued PT services    Follow Up Recommendations  No PT follow up       Barriers to Discharge None      Equipment Recommendations  None recommended by PT    Recommendations for Other Services  (none)   Frequency Min 5X/week    Precautions / Restrictions Precautions Precautions: Back Precaution Booklet Issued: Yes (comment) Precaution Comments: pt educated on 3/3 back precautions Required Braces or Orthoses: Spinal Brace Spinal Brace: Lumbar corset;Applied in sitting position Restrictions Weight Bearing Restrictions: No   Pertinent Vitals/Pain 5-6/10      Mobility  Bed Mobility Bed Mobility: Rolling Right;Right Sidelying to Sit;Sitting - Scoot to Edge of Bed Rolling Right: 4: Min guard;5: Supervision Rolling Left: 5: Supervision Right Sidelying to Sit: 4: Min guard;5: Supervision Sitting - Scoot to [place] of Bed: 4: Min assist;5: Supervision Sit to Sidelying Right: 5: Supervision Details for Bed Mobility Assistance: VC for safe sequencing to maintain back precautions during transfer. Min assist through trunk for support into sitting Transfers Transfers: Stand to Sit;Sit to Stand Sit to Stand: 5: Supervision;With  upper extremity assist;From chair/3-in-1 Stand to Sit: 5: Supervision;With upper extremity assist;To bed Details for Transfer Assistance: VC for proper sequencing and proper hand placement. Assist for controlled ascent/descent Ambulation/Gait Ambulation/Gait Assistance: 5: Supervision Ambulation Distance (Feet): 70 Feet Assistive device: None Ambulation/Gait Assistance Details: VC's for upright posutre and shoulder rolls Gait Pattern: Decreased stride length;Decreased hip/knee flexion - right;Decreased hip/knee flexion - left;Narrow base of support Gait velocity: slow gait speed General Gait Details: patient steady but feels "foggy" Stairs: Yes Stairs Assistance: 5: Supervision Stairs Assistance Details (indicate cue type and reason): VC's for hand placement on rail Stair Management Technique: Two rails;Alternating pattern;Forwards Number of Stairs: 5            PT Diagnosis: Difficulty walking;Acute pain  PT Problem List: Decreased activity tolerance;Decreased mobility;Decreased knowledge of use of DME;Decreased safety awareness;Decreased knowledge of precautions;Pain PT Treatment Interventions: DME instruction;Gait training;Stair training;Functional mobility training;Therapeutic activities;Patient/family education   PT Goals Acute Rehab PT Goals PT Goal Formulation: With patient/family Time For Goal Achievement: 05/13/12 Potential to Achieve Goals: Good Pt will go Supine/Side to Sit: with modified independence PT Goal: Supine/Side to Sit - Progress: Goal set today Pt will go Sit to Supine/Side: with modified independence PT Goal: Sit to Supine/Side - Progress: Goal set today Pt will go Sit to Stand: with modified independence PT Goal: Sit to Stand - Progress: Goal set today Pt will go Stand to Sit: with modified independence PT Goal: Stand to Sit - Progress: Goal set today Pt will Transfer Bed to Chair/Chair to Bed: with modified independence PT Transfer Goal: Bed to  Chair/Chair to Bed - Progress: Goal set today Pt will Ambulate: >150 feet;with  modified independence;with least restrictive assistive device PT Goal: Ambulate - Progress: Goal set today Pt will Go Up / Down Stairs: 1-2 stairs;with min assist PT Goal: Up/Down Stairs - Progress: Goal set today  Visit Information  Last PT Received On: 06/01/12 Assistance Needed: +1    Subjective Data  Subjective: Can you tell me if I'm doing any twisting that i don't realize Patient Stated Goal: to go home tomorrow   Prior Functioning  Home Living Lives With: Spouse Available Help at Discharge: Family;Available 24 hours/day Type of Home: House Home Access: Stairs to enter [employer] of Steps: 2 Entrance Stairs-Rails: None Home Layout: Two level;Able to live on main level with bedroom/bathroom Alternate Level Stairs-Number of Steps: 12 Alternate Level Stairs-Rails: Right Bathroom Shower/Tub: Walk-in shower;Door [employer] Toilet: Standard Bathroom Accessibility: Yes How Accessible: Accessible via walker Home Adaptive Equipment: Bedside commode/3-in-1 Prior Function Level of Independence: Independent Able to Take Stairs?: Yes Driving: Yes Vocation: Unemployed Communication Communication: No difficulties Dominant Hand: Right    Cognition  Overall Cognitive Status: Appears within functional limits for tasks assessed/performed Arousal/Alertness: Awake/alert Orientation Level: Appears intact for tasks assessed Behavior During Session: [facility] for tasks performed    Extremity/Trunk Assessment Right Upper Extremity Assessment RUE ROM/Strength/Tone: Within functional levels Left Upper Extremity Assessment LUE ROM/Strength/Tone: Within functional levels Right Lower Extremity Assessment RLE ROM/Strength/Tone: Within functional levels RLE Sensation: WFL - Light Touch Left Lower Extremity Assessment LLE ROM/Strength/Tone: Within functional levels LLE Sensation: WFL - Light Touch   Balance  Balance Balance Assessed: Yes High Level Balance High Level Balance Activites: Side stepping;Backward walking;Direction changes;Turns;Sudden stops;Head turns High Level Balance Comments: patient steady with all aspects of balance at supervision level  End of Session PT - End of Session Equipment Utilized During Treatment: Gait belt;Back brace Activity Tolerance: Patient tolerated treatment well Patient left: in chair;with call bell/phone within reach;with family/visitor present;with nursing in room Nurse Communication: Mobility status  GP     Fabio Asa 06/01/2012, 9:56 AM Charlotte Crumb, PT DPT  [phone]

## 2012-06-01 NOTE — Evaluation (Signed)
Occupational Therapy Evaluation Patient Details Name: Judith Mcdonald MRN: [id number] DOB: 1956-02-27 Today's Date: 06/01/2012 Time: 4098-1191 OT Time Calculation (min): 29 min  OT Assessment / Plan / Recommendation Clinical Impression  Pt is recovering from revision of hardware in her lumbar spine placed approximately 3 weeks ago.  Pt is primarily at a supervision to min guard level in mobility and ADL.  Pt is knowledgeable in her back precautions.  No further OT needs.    OT Assessment  Patient does not need any further OT services    Follow Up Recommendations  No OT follow up;Supervision - Intermittent    Barriers to Discharge      Equipment Recommendations  None recommended by OT    Recommendations for Other Services    Frequency       Precautions / Restrictions Precautions Precautions: Back;Fall Precaution Booklet Issued: Yes (comment) Precaution Comments: pt educated on 3/3 back precautions Required Braces or Orthoses: Spinal Brace Spinal Brace: Lumbar corset;Applied in sitting position Restrictions Weight Bearing Restrictions: No   Pertinent Vitals/Pain 5-6/10 back, premedicated, repositioned    ADL  Eating/Feeding: Independent Where Assessed - Eating/Feeding: Edge of bed Grooming: Supervision/safety Where Assessed - Grooming: Unsupported standing Upper Body Bathing: Supervision/safety Where Assessed - Upper Body Bathing: Supported standing Lower Body Bathing: Supervision/safety Where Assessed - Lower Body Bathing: Supported standing Upper Body Dressing: Set up Where Assessed - Upper Body Dressing: Unsupported sitting Lower Body Dressing: Supervision/safety Where Assessed - Lower Body Dressing: Unsupported sitting;Supported sit to stand Toilet Transfer: Supervision/safety Toilet Transfer Method: Sit to stand Equipment Used: Gait belt;Back brace Transfers/Ambulation Related to ADLs: Supervision for transfers and mobility, slightly unsteady without device.    ADL Comments: Reviewed all precautions related to ADL and IADL.  Pt with concerns that she did not follow her back precautions and contributed to this surgery.     OT Diagnosis:    OT Problem List:   OT Treatment Interventions:     OT Goals    Visit Information  Last OT Received On: 06/01/12 Assistance Needed: +1 PT/OT Co-Evaluation/Treatment: Yes    Subjective Data  Subjective: "Am I doing something wrong?" Patient Stated Goal: Avoid further surgery.   Prior Functioning     Home Living Lives With: Spouse Available Help at Discharge: Family;Available 24 hours/day Type of Home: House Home Access: Stairs to enter [employer] of Steps: 2 Entrance Stairs-Rails: None Home Layout: Two level;Able to live on main level with bedroom/bathroom Alternate Level Stairs-Number of Steps: 12 Alternate Level Stairs-Rails: Right Bathroom Shower/Tub: Walk-in shower;Door [employer] Toilet: Standard Bathroom Accessibility: Yes How Accessible: Accessible via walker Home Adaptive Equipment: Bedside commode/3-in-1;Walker - rolling Prior Function Level of Independence: Needs assistance Needs Assistance: Meal Prep;Light Housekeeping Meal Prep: Total Light Housekeeping: Total Able to Take Stairs?: Yes Driving: Yes Vocation: Unemployed Communication Communication: No difficulties Dominant Hand: Right         Vision/Perception     Cognition  Overall Cognitive Status: Appears within functional limits for tasks assessed/performed Arousal/Alertness: Awake/alert Orientation Level: Appears intact for tasks assessed Behavior During Session: [facility] for tasks performed    Extremity/Trunk Assessment Right Upper Extremity Assessment RUE ROM/Strength/Tone: [facility] for tasks assessed Left Upper Extremity Assessment LUE ROM/Strength/Tone: WFL for tasks assessed Right Lower Extremity Assessment RLE ROM/Strength/Tone: Within functional levels RLE Sensation: WFL - Light Touch Left Lower  Extremity Assessment LLE ROM/Strength/Tone: Within functional levels LLE Sensation: WFL - Light Touch Trunk Assessment Trunk Assessment: Normal     Mobility Bed Mobility Bed Mobility: Rolling  Right;Right Sidelying to Sit;Sitting - Scoot to Edge of Bed Rolling Right: 4: Min guard;5: Supervision Rolling Left: 5: Supervision Right Sidelying to Sit: 4: Min guard;5: Supervision Sitting - Scoot to [employer] of Bed: 4: Min assist;5: Supervision Sit to Sidelying Right: 5: Supervision Details for Bed Mobility Assistance: VC for safe sequencing to maintain back precautions during transfer. Min assist through trunk for support into sitting Transfers Sit to Stand: 5: Supervision;With upper extremity assist;From chair/3-in-1 Stand to Sit: 5: Supervision;With upper extremity assist;To bed Details for Transfer Assistance: VC for proper sequencing and proper hand placement. Assist for controlled ascent/descent     Shoulder Instructions     Exercise     Balance Balance Balance Assessed: Yes High Level Balance High Level Balance Activites: Side stepping;Backward walking;Direction changes;Turns;Sudden stops;Head turns High Level Balance Comments: patient steady with all aspects of balance at supervision level   End of Session OT - End of Session Activity Tolerance: Patient tolerated treatment well Patient left: in bed;with call bell/phone within reach;with family/visitor present  GO     Evern Bio 06/01/2012, 11:07 AM [phone]

## 2012-06-01 NOTE — Progress Notes (Signed)
Subjective: Patient reports That she feels better overall a lot of the radicular symptoms are improved she's having worsening pain in the instep of her foot it seems to be more localized to the foot than before but she's feeling in her calf and anterior shin has improved.  Objective: Vital signs in last 24 hours: Temp:  [97.4 F (36.3 C)-98.1 F (36.7 C)] 97.9 F (36.6 C) (12/17 0648) Pulse Rate:  [56-85] 75  (12/17 0648) Resp:  [13-20] 18  (12/17 0648) BP: (96-137)/(63-85) 96/63 mmHg (12/17 0648) SpO2:  [96 %-100 %] 99 % (12/17 0648) Weight:  [70.6 kg (155 lb 10.3 oz)] 70.6 kg (155 lb 10.3 oz) (12/16 2200)  Intake/Output from previous day: 12/16 0701 - 12/17 0700 In: 2160 [P.O.:120; I.V.:1740; IV Piggyback:300] Out: 1995 [Urine:1825; Drains:170] Intake/Output this shift:    strength is 5 out of 5 in dorsiflexion of both feet as well as EHL  Lab Results: No results found for this basename: WBC:2,HGB:2,HCT:2,PLT:2 in the last 72 hours BMET No results found for this basename: NA:2,K:2,CL:2,CO2:2,GLUCOSE:2,BUN:2,CREATININE:2,CALCIUM:2 in the last 72 hours  Studies/Results: Dg Lumbar Spine 2-3 Views  05/31/2012  *RADIOLOGY REPORT*  Clinical Data:  Left L5 screw exchange.  Exam: Three-view lumbar spine  Comparison: CT dated 05/21/2012.  Findings: Two AP and one lateral C-arm view from the operating room are submitted for interpretation.  These demonstrate retractors in place with L4-S1 pedicle screws bilaterally as well as interbody bone plugs at the L4-5 and L5-S1 levels.  The right-sided rod is in place and the left rod has been removed.  IMPRESSION: Operative changes, as described above.   Original Report Authenticated By: Beckie Salts, M.D.    Dg C-arm 1-60 Min  05/31/2012  *RADIOLOGY REPORT*  Clinical Data:  Left L5 screw exchange.  Exam: Three-view lumbar spine  Comparison: CT dated 05/21/2012.  Findings: Two AP and one lateral C-arm view from the operating room are submitted for  interpretation.  These demonstrate retractors in place with L4-S1 pedicle screws bilaterally as well as interbody bone plugs at the L4-5 and L5-S1 levels.  The right-sided rod is in place and the left rod has been removed.  IMPRESSION: Operative changes, as described above.   Original Report Authenticated By: Beckie Salts, M.D.     Assessment/Plan: Progressive mobilization today we'll start Decadron DC her Foley  LOS: 1 day     Ichelle Harral P 06/01/2012, 7:35 AM

## 2012-06-01 NOTE — Progress Notes (Signed)
Pt. Had episode of tingling of the lips and mouth. Pt. had just recently received Dilaudid IV and Decadron IV. Symptoms resolved within minutes. Sats 96% RA, no signs of swelling seen. Dr. Wynetta Emery notified of events, order received to d/c Dilaudid. Will monitor.

## 2012-06-01 NOTE — Discharge Summary (Signed)
Physician Discharge Summary  Patient ID: Judith Mcdonald MRN: [id number] DOB/AGE: 1955-11-18 56 y.o.  Admit date: 05/31/2012 Discharge date: 06/01/2012  Admission Diagnoses: Left L5 radiculopathy   Discharge Diagnoses: Same Active Problems:  * No active hospital problems. *    Discharged Condition: good  Hospital Course: Patient is admitted hospital underwent a reexploration of fusion removal of hardware repositioning of the left L5 pedicle screw and repositioning of an interbody cage placed. Postoperatively patient recovered in the floor on the floor was convalescing well had improvement in a lot of her leg pain however had some persistent pain in the instep of her foot but improved sensation in her foot and improved strength on dorsiflexion and EHL. Patient progress immobilize and was angling and voiding spontaneously and posterior day 1 and was stable and be discharged home.  Consults: Significant Diagnostic Studies: Treatments: Exploration of fusion removal of hardware repositioning of the left L5 screw and interbody cage. Discharge Exam: Blood pressure 95/47, pulse 84, temperature 98 F (36.7 C), temperature source Oral, resp. rate 16, height 5' 5.5" (1.664 m), weight 70.6 kg (155 lb 10.3 oz), SpO2 98.00%. Strength out of 5 wound clean and dry  Disposition: Home  Discharge Orders    Future Appointments: Provider: Department: Dept Phone: Center:   11/25/2012 1:00 PM [facility] [phone] [facility]   11/25/2012 2:00 PM Evern Bio, NP [facility] [phone] [facility]       Medication List     As of 06/01/2012  5:53 PM    TAKE these medications         aspirin EC 81 MG tablet   Take 81 mg by mouth daily.      CALCIUM PLUS VITAMIN D PO   Take 1 tablet by mouth daily.      cholecalciferol 1000 UNITS tablet   Commonly known as: VITAMIN D   Take 1,000 Units by mouth daily.      cyclobenzaprine 10  MG tablet   Commonly known as: FLEXERIL   Take 1 tablet (10 mg total) by mouth 3 (three) times daily as needed for muscle spasms.      esomeprazole 40 MG capsule   Commonly known as: NEXIUM   Take 40 mg by mouth daily before breakfast.      fluticasone 50 MCG/ACT nasal spray   Commonly known as: FLONASE   Place 1-2 sprays into the nose daily.      gabapentin 300 MG capsule   Commonly known as: NEURONTIN   Take 300 mg by mouth at bedtime.      hydrochlorothiazide 25 MG tablet   Commonly known as: HYDRODIURIL   Take 25 mg by mouth daily.      levothyroxine 75 MCG tablet   Commonly known as: SYNTHROID, LEVOTHROID   Take 75 mcg by mouth daily.      lisinopril 10 MG tablet   Commonly known as: PRINIVIL,ZESTRIL   Take 2.5 mg by mouth every other day. Takes 1/4 tab daily      nitrofurantoin 50 MG capsule   Commonly known as: MACRODANTIN   Take 1 capsule (50 mg total) by mouth as needed.      Oxycodone HCl 10 MG Tabs   Take 1 tablet (10 mg total) by mouth every 4 (four) hours as needed.      Oxycodone HCl 10 MG Tabs   Take 1 tablet (10 mg total) by mouth every 4 (four) hours as  needed.      rosuvastatin 10 MG tablet   Commonly known as: CRESTOR   Take 5 mg by mouth daily.      senna-docusate 8.6-50 MG per tablet   Commonly known as: Senokot-S   Take 1 tablet by mouth daily.      VAGIFEM 10 MCG Tabs   Generic drug: Estradiol   Place 1 tablet vaginally every 30 (thirty) days. Every 3 weeks         Signed: Terilyn Sano P 06/01/2012, 5:53 PM

## 2012-07-31 ENCOUNTER — Other Ambulatory Visit: Payer: Self-pay

## 2012-11-18 ENCOUNTER — Other Ambulatory Visit: Payer: Self-pay | Admitting: *Deleted

## 2012-11-18 DIAGNOSIS — I773 Arterial fibromuscular dysplasia: Secondary | ICD-10-CM

## 2012-11-25 ENCOUNTER — Other Ambulatory Visit: Payer: PRIVATE HEALTH INSURANCE

## 2012-11-25 ENCOUNTER — Ambulatory Visit: Payer: PRIVATE HEALTH INSURANCE | Admitting: Neurosurgery

## 2012-12-02 ENCOUNTER — Other Ambulatory Visit (INDEPENDENT_AMBULATORY_CARE_PROVIDER_SITE_OTHER): Payer: PRIVATE HEALTH INSURANCE | Admitting: Vascular Surgery

## 2012-12-02 DIAGNOSIS — I6529 Occlusion and stenosis of unspecified carotid artery: Secondary | ICD-10-CM

## 2012-12-02 DIAGNOSIS — I7789 Other specified disorders of arteries and arterioles: Secondary | ICD-10-CM

## 2012-12-02 DIAGNOSIS — I773 Arterial fibromuscular dysplasia: Secondary | ICD-10-CM

## 2012-12-03 ENCOUNTER — Other Ambulatory Visit: Payer: Self-pay

## 2012-12-03 DIAGNOSIS — I7789 Other specified disorders of arteries and arterioles: Secondary | ICD-10-CM

## 2012-12-06 ENCOUNTER — Encounter: Payer: Self-pay | Admitting: Vascular Surgery

## 2013-03-29 ENCOUNTER — Other Ambulatory Visit: Payer: Self-pay

## 2013-03-29 DIAGNOSIS — Z1231 Encounter for screening mammogram for malignant neoplasm of breast: Secondary | ICD-10-CM

## 2013-04-14 ENCOUNTER — Ambulatory Visit: Payer: PRIVATE HEALTH INSURANCE

## 2013-04-19 ENCOUNTER — Ambulatory Visit: Payer: PRIVATE HEALTH INSURANCE

## 2013-04-21 ENCOUNTER — Other Ambulatory Visit: Payer: Self-pay

## 2013-05-03 ENCOUNTER — Ambulatory Visit
Admission: RE | Admit: 2013-05-03 | Discharge: 2013-05-03 | Disposition: A | Discharge status: Home / Self Care | Payer: PRIVATE HEALTH INSURANCE | Source: Ambulatory Visit

## 2013-05-03 DIAGNOSIS — Z1231 Encounter for screening mammogram for malignant neoplasm of breast: Secondary | ICD-10-CM

## 2013-07-29 ENCOUNTER — Other Ambulatory Visit: Payer: Self-pay | Admitting: Vascular Surgery

## 2013-07-29 DIAGNOSIS — I7789 Other specified disorders of arteries and arterioles: Secondary | ICD-10-CM

## 2013-08-23 IMAGING — CR DG CHEST 1V PORT
1 series · 1 of 1 positions shown · non-contrast
Comparison: None.

CLINICAL DATA: Chest pain, hypertension.

PORTABLE CHEST - 1 VIEW

[AP]
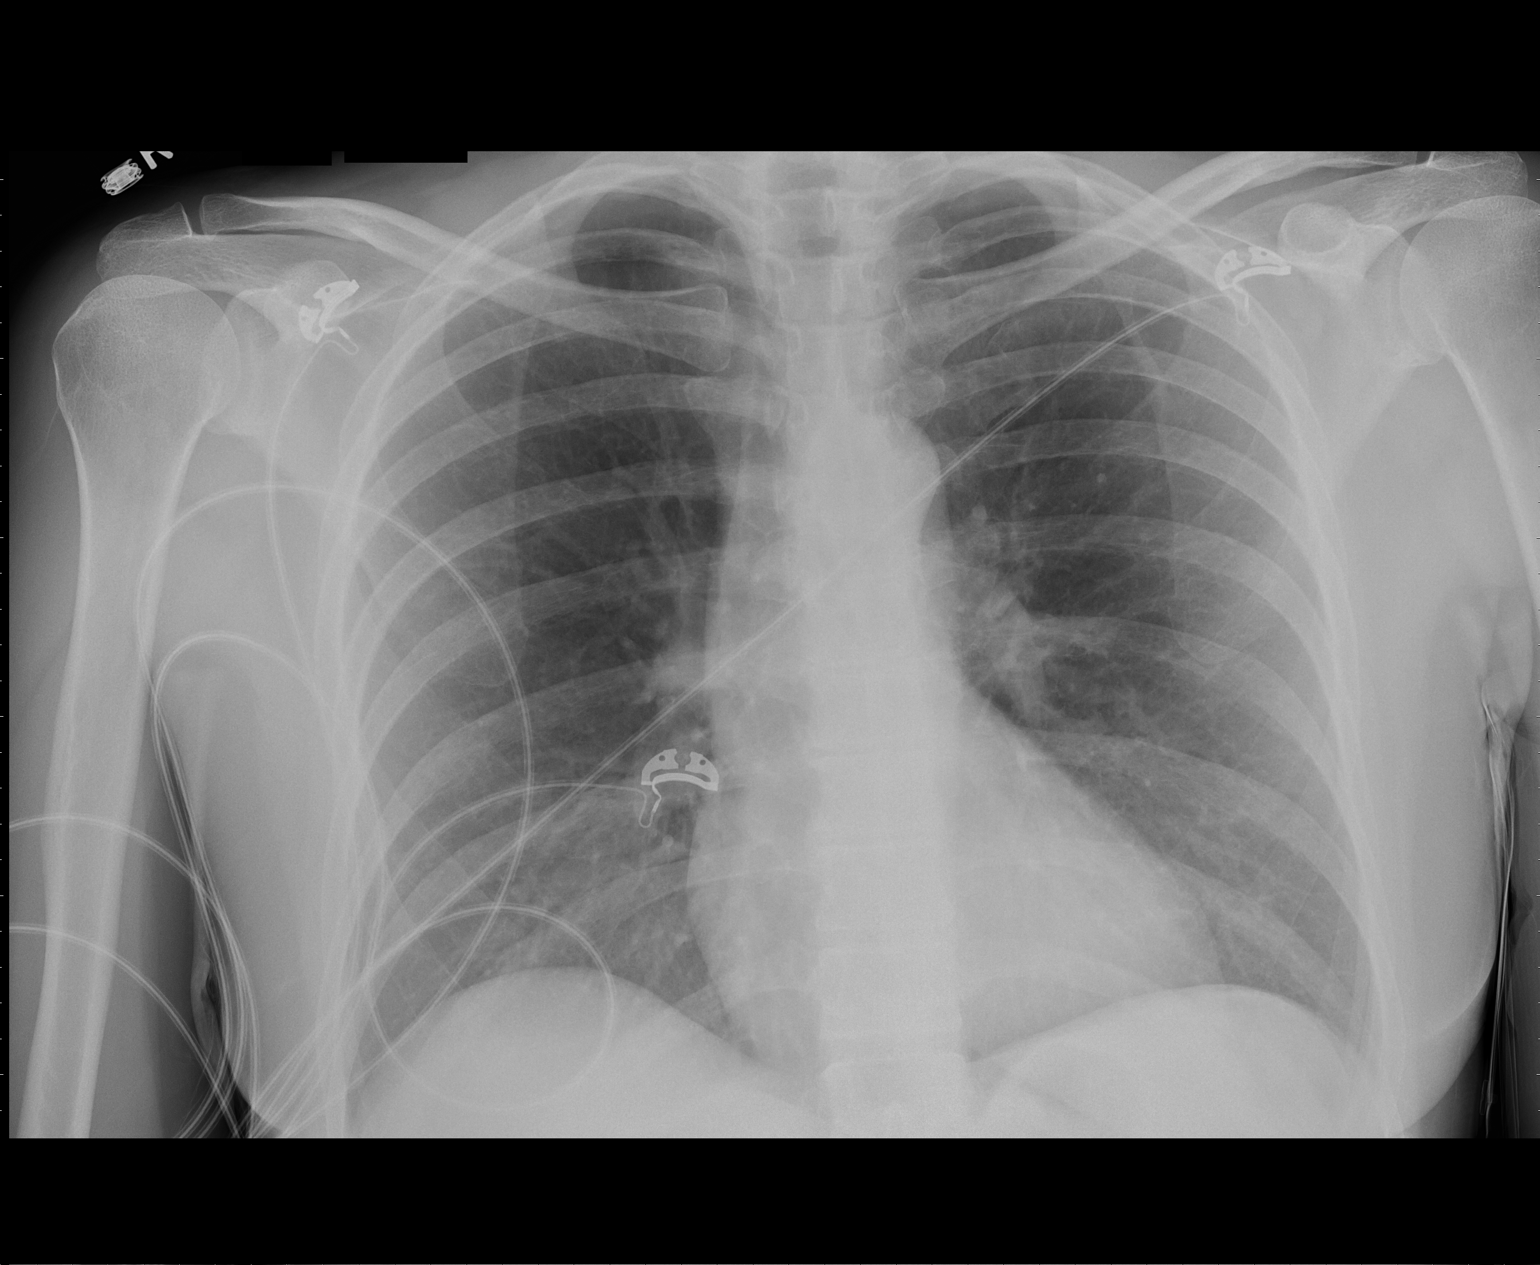

[1 of 1 positions shown; findings below may reference images not displayed]

FINDINGS: Heart and mediastinal contours are within normal limits.
No focal opacities or effusions.  No acute bony abnormality.
IMPRESSION: No active cardiopulmonary disease.

## 2013-12-05 ENCOUNTER — Encounter: Payer: Self-pay | Admitting: Vascular Surgery

## 2013-12-06 ENCOUNTER — Ambulatory Visit (HOSPITAL_COMMUNITY)
Admission: RE | Admit: 2013-12-06 | Discharge: 2013-12-06 | Disposition: A | Discharge status: Home / Self Care | Payer: PRIVATE HEALTH INSURANCE | Source: Ambulatory Visit | Attending: Vascular Surgery | Admitting: Vascular Surgery

## 2013-12-06 ENCOUNTER — Encounter: Payer: Self-pay | Admitting: Vascular Surgery

## 2013-12-06 ENCOUNTER — Ambulatory Visit (INDEPENDENT_AMBULATORY_CARE_PROVIDER_SITE_OTHER): Payer: PRIVATE HEALTH INSURANCE | Admitting: Vascular Surgery

## 2013-12-06 VITALS — BP 115/77 | HR 74 | Ht 65.0 in | Wt 154.0 lb

## 2013-12-06 DIAGNOSIS — I7789 Other specified disorders of arteries and arterioles: Secondary | ICD-10-CM

## 2013-12-06 DIAGNOSIS — I6529 Occlusion and stenosis of unspecified carotid artery: Secondary | ICD-10-CM

## 2013-12-06 NOTE — Progress Notes (Signed)
[facility] HISTORY AND PHYSICAL   CC:  Follow up carotid duplex scan  Referring Provider:  Marton Redwood, MD  HPI: This is a 58 y.o. female who has known carotid stenosis is here for f/u carotid duplex scan.  Denies amaurosis fugax, paresthesias, or hemiparesis.    She did have #1 Gill decompressive lumbar laminectomy the L4-5  #2 decompressive lumbar laminectomy L5-S1 and excess will be needed with a standard interbody fusion  #3 posterior lumbar interbody fusion L4-5 and L5-S1 using a caliber expandable peek cages packed with local autograft mixed with DBX  #4 pedicle screw fixation L4-S1 using the 5.5 globus Revere pedicle screw system  #5 posterior lateral arthrodesis L4-S1 using local autograft mixed with DBX  #6 open reduction spinal deformity May 05, 2012.  She did have reexploration of fusion and removal of hardware on the left with exploration of the LR-L5 and S1 screws and reexploration of the left L5 neural foramen in December of 2013.  During this visit, she does have complaints of numbness in her left foot as well as her left wrist.  She denies any claudication type symptoms.  She states that she does have microscopic blood in her urine/stool and does have an appt with urology tomorrow and GI appt in July.    She is on an ACEI for her blood pressure.  She takes an aspirin daily.  She is on a statin for her cholesterol.  Past Medical History  Diagnosis Date  . H/O typhus     at age 30 yrs  . Hx: UTI (urinary tract infection)   . Hx of menorrhagia   . Irregular menses   . Hypothyroidism   . Hx of migraines     with menses  . Hx of amenorrhea   . History of recurrent UTIs   . Mastodynia   . Hypercholesteremia   . Fibromuscular dysplasia   . GERD (gastroesophageal reflux disease)   . 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 Normal cardiac stress test 2013    Dr Wynonia Lawman  . Complication of anesthesia     Patient has  fibromuscular dyplasia of carotid arteries,"neck positioning is important"  . Hypertension     Dr. Brigitte Pulse, [place]  . Arthritis   . Constipation    Past Surgical History  Procedure Laterality Date  . Cesarean section      2x in 1984 & 1986  . Nasal septum surgery    . Cardiac catheterization  [address] [place]  . Cardiovascular stress test      done at Dr. Thurman Coyer office  . Back surgery      May 05, 2012  . Hardware removal  05/31/2012    Procedure: HARDWARE REMOVAL;  Surgeon: Elaina Hoops, MD;  Location: [facility];  Service: Neurosurgery;  Laterality: N/A;  Exploration of Lumbar Fusion Lumbar Four to Sacral One     Allergies  Allergen Reactions  . Hydromorphone Hcl Other (See Comments)    Lip tingling    Current Outpatient Prescriptions  Medication Sig Dispense Refill  . aspirin EC 81 MG tablet Take 81 mg by mouth daily.      . cholecalciferol (VITAMIN D) 1000 UNITS tablet Take 1,000 Units by mouth daily.       Marland Kitchen esomeprazole (NEXIUM) 40 MG capsule Take 40 mg by mouth daily before breakfast.      . Estradiol (VAGIFEM) 10 MCG  TABS Place 1 tablet vaginally every 30 (thirty) days. Every 3 weeks      . fluticasone (FLONASE) 50 MCG/ACT nasal spray Place 1-2 sprays into the nose daily.      . hydrochlorothiazide (HYDRODIURIL) 25 MG tablet Take 25 mg by mouth daily.      Marland Kitchen levothyroxine (SYNTHROID, LEVOTHROID) 75 MCG tablet Take 75 mcg by mouth daily.      . nitrofurantoin (MACRODANTIN) 50 MG capsule Take 1 capsule (50 mg total) by mouth as needed.  1 capsule  12  . rosuvastatin (CRESTOR) 10 MG tablet Take 5 mg by mouth daily.       . Calcium Carbonate-Vitamin D (CALCIUM PLUS VITAMIN D PO) Take 1 tablet by mouth daily.      . cyclobenzaprine (FLEXERIL) 10 MG tablet Take 1 tablet (10 mg total) by mouth 3 (three) times daily as needed for muscle spasms.  80 tablet  1  . gabapentin (NEURONTIN) 300 MG capsule Take 300 mg by mouth at bedtime.      Marland Kitchen lisinopril  (PRINIVIL,ZESTRIL) 10 MG tablet Take 2.5 mg by mouth every other day. Takes 1/4 tab daily      . oxyCODONE 10 MG TABS Take 1 tablet (10 mg total) by mouth every 4 (four) hours as needed.  80 tablet  0  . oxyCODONE 10 MG TABS Take 1 tablet (10 mg total) by mouth every 4 (four) hours as needed.  80 tablet  0  . senna-docusate (SENOKOT-S) 8.6-50 MG per tablet Take 1 tablet by mouth daily.       No current facility-administered medications for this visit.    Pt's meds include: Statin:  no Beta Blocker:  no Aspirin:  yes Other antiplatelets/anticoagulants:  no   Family History  Problem Relation Age of Onset  . Hypertension Father   . Cancer Father   . Heart disease Father   . Hyperlipidemia Father   . Heart disease Maternal Grandmother   . Hyperlipidemia Mother   . Hyperlipidemia Brother   . Hypertension Brother     History   Social History  . Marital Status: Married    Spouse Name: N/A    Number of Children: N/A  . Years of Education: N/A   Occupational History  . Not on file.   Social History Main Topics  . Smoking status: Never Smoker   . Smokeless tobacco: Never Used  . Alcohol Use: Yes     Comment: social  . Drug Use: No  . Sexual Activity: Yes    Birth Control/ Protection: None   Other Topics Concern  . Not on file   Social History Narrative  . No narrative on file     ROS: [x]  Positive   [ ]  Negative   [ ]  All sytems reviewed and are negative  Cardiovascular: []  chest pain/pressure []  palpitations []  SOB lying flat []  DOE []  pain in legs while walking [x]  pain in feet when lying flat-numbness from back surgery  []  hx of DVT []  hx of phlebitis []  swelling in legs []  varicose veins  Pulmonary: []  productive cough []  asthma []  wheezing  Neurologic: []  weakness in []  arms []  legs [x]  numbness in [x]  left arm [x]  left foot [] difficulty speaking or slurred speech []  temporary loss of vision in one eye []  dizziness  Hematologic: []  bleeding  problems []  problems with blood clotting easily  GI []  vomiting blood [x]  blood in stool [x]  GERD  GU: []  burning with urination [x]  blood  in urine  Psychiatric: []  hx of major depression  Integumentary: []  rashes []  ulcers  Constitutional: []  fever []  chills   PHYSICAL EXAMINATION:  Filed Vitals:   12/06/13 1507  BP: 115/77  Pulse:    Body mass index is 25.63 kg/(m^2).  General:  WDWN in NAD Gait: Normal HENT: WNL; normocephalic Eyes: PERRL Pulmonary: normal non-labored breathing , without Rales, rhonchi,  wheezing Cardiac: RRR, without  Murmurs, rubs or gallops; without carotid bruits Abdomen: soft, NT, no masses Skin: without rashes,  ulcers  Vascular Exam/Pulses:   Right Left  Radial 2+ (normal) 2+ (normal)  Ulnar Unable to palpate 1+ (weak)  Popliteal Unable to palpate Unable to palpate  DP 2+ (normal) 2+ (normal)   Extremities: without ischemic changes, without Gangrene , without cellulitis; without open wounds;  Musculoskeletal: without muscle wasting or atrophy  Neurologic: A&O X 3; Appropriate Affect ; SENSATION: normal; MOTOR FUNCTION:  moving all extremities equally. Speech is fluent/normal   Non-Invasive Vascular Imaging: Carotid Duplex Scan:  12/06/2013  1.  Less than 40% bilateral internal carotid artery stenosis 2.  Distal internal carotid arteries are noted to be very tortuous.  -No change compared to previous exam.  ASSESSMENT/PLAN:: 58 y.o. female here for f/u carotid duplex scan with hx of fibromuscular dysplasia   -the pt carotid duplex is unchanged from 2 years ago.  Dr. Donnetta Hutching does not feel like the pt's numbness in her hand or her left foot is from a vascular issue as she does have normal pulses. -She will follow up in 2 years with a carotid duplex scan.  She will call sooner if she has any issues before then.    Leontine Locket, PA-C [facility] [phone]  Clinic MD:   Pt seen and examined in conjunction  with Dr. Donnetta Hutching  I have examined the patient, reviewed and agree with above. Long discussion with the patient. She had an incidental finding of fibromuscular dysplasia of her carotids many years ago. She's had no change regarding her distal carotid velocities over some time. I didn't describe symptoms of carotid disease to her to notify should this occur. Otherwise we will see her in 2 years with repeat duplex. She has multiple other symptoms but none of these are related to peripheral vascular occlusive disease. She does have totally normal dorsalis pedis pulses  EARLY, TODD, MD 12/06/2013 5:12 PM   For VQI Use Only    PRE-ADM LIVING: [x ] Home, [ ]  Nursing home, [ ]  Homeless  AMB STATUS: [x ] Walking, [ ]  Walking w/ Assistance, [ ]  Wheelchair, [ ] Bed ridden  RECENT HEART ATTACK (<6 mon): No  CAD Sx: [ ]  No, [ ]  Asx, h/o MI, [ ]  Stable angina, [ ]  Unstable angina  PRIOR CHF: [ x] No, [ ]  Asx, [ ]  Mild, [ ]  Moderate, [ ]  Severe  STRESS TEST: [ x] No, [ ]  Normal, [ ]  + ischemia, [ ]  + MI, [ ]  Both

## 2014-03-25 IMAGING — CR DG CHEST 2V
2 series · 2 of 2 positions shown · non-contrast
Comparison: 12/25/2011

CLINICAL DATA: Cough, history of hypertension

CHEST - 2 VIEW

[view not recorded (1 of 2)]
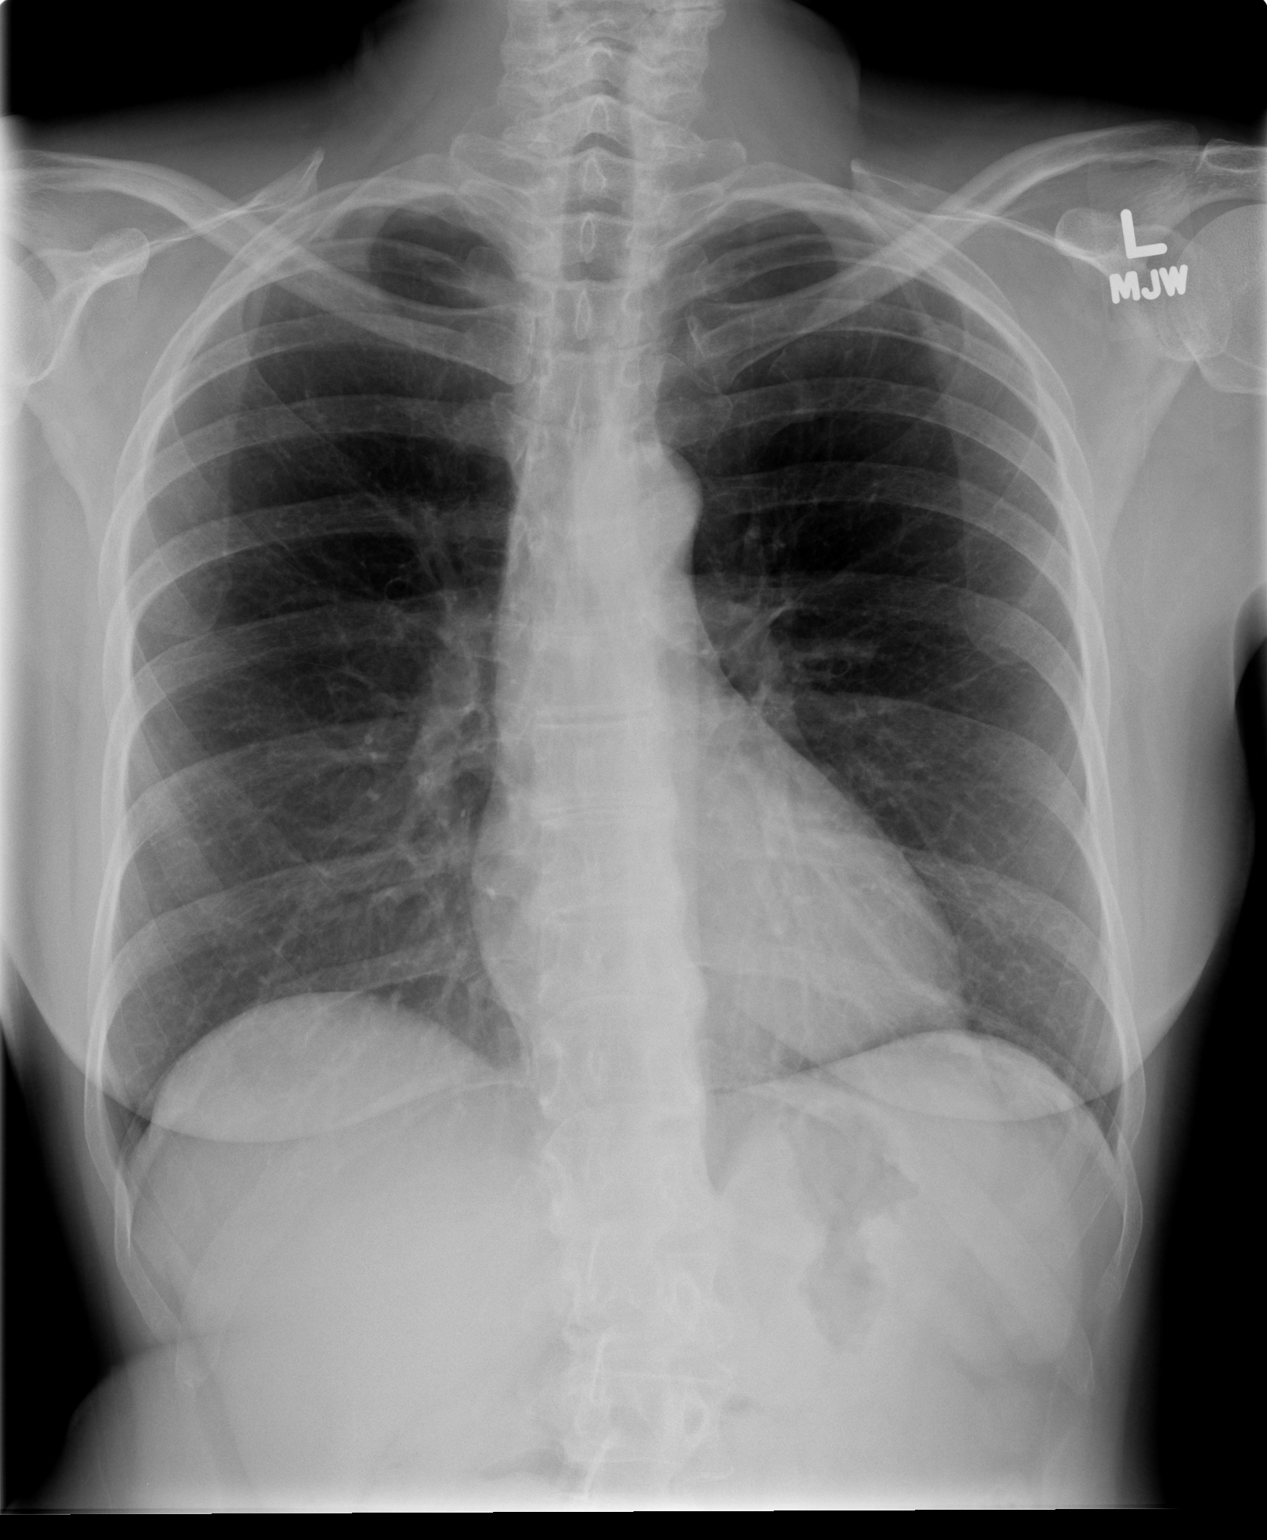

[view not recorded (2 of 2)]
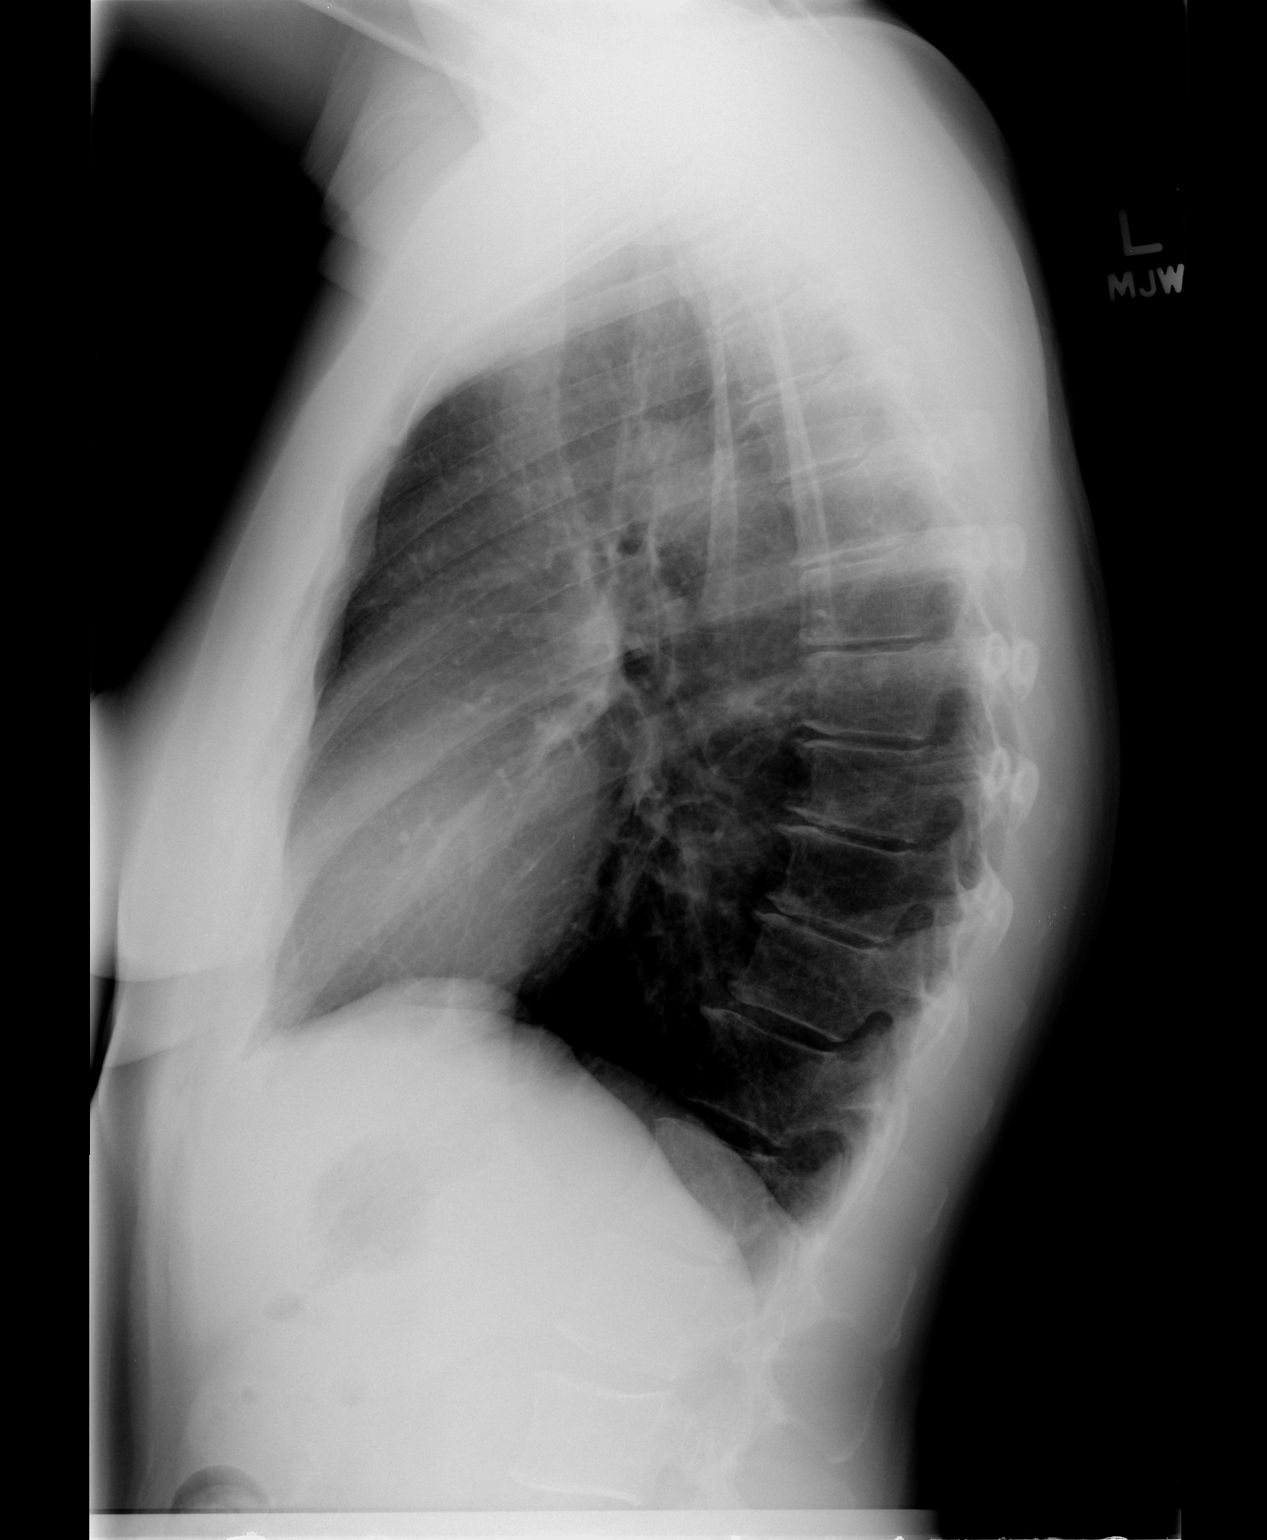

[2 of 2 positions shown; findings below may reference images not displayed]

FINDINGS: The heart and pulmonary vascularity are within normal
limits.  The lungs are clear bilaterally. No focal infiltrate or
sizable effusion is seen.
IMPRESSION: No acute abnormality noted.

## 2014-04-03 IMAGING — RF DG LUMBAR SPINE 2-3V
1 series · 2 of 2 positions shown · non-contrast
Comparison: CT 03/10/2012

CLINICAL DATA: Fusion surgery

LUMBAR SPINE - 2-3 VIEW

[Series 1: run · 2 of 2 slices shown]
[im 1/2]
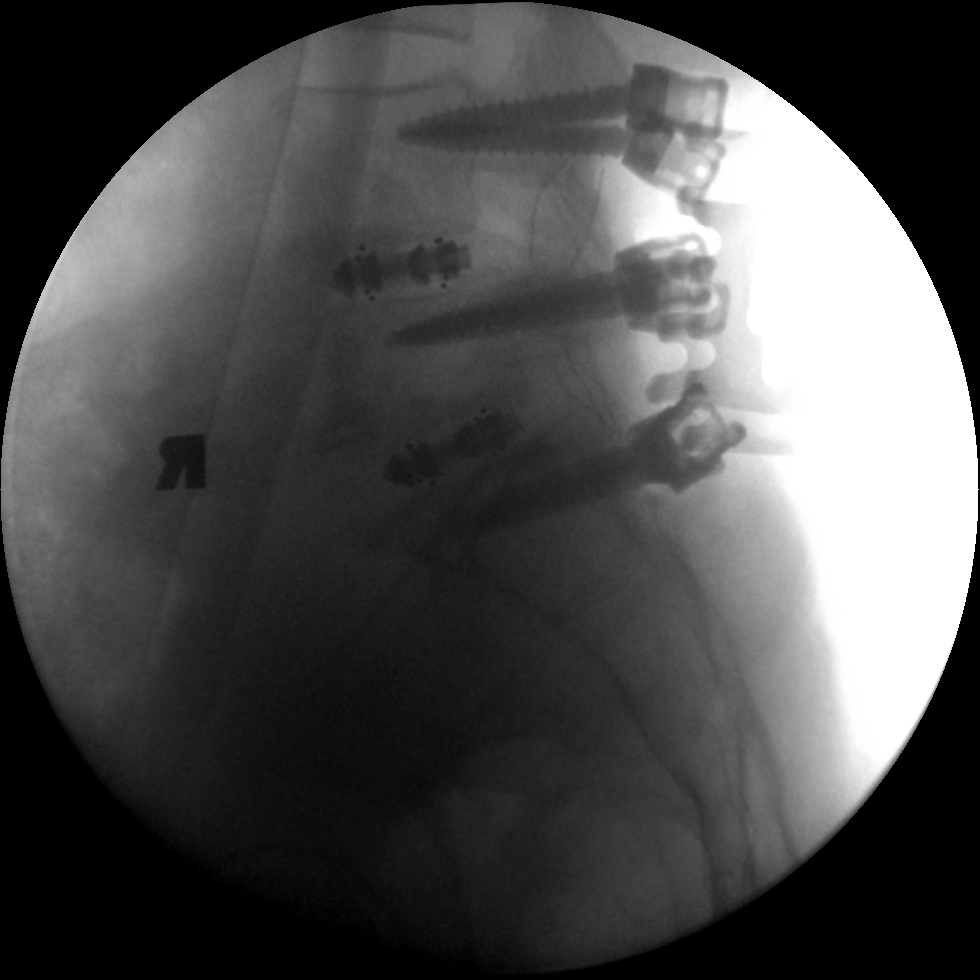
[im 2/2]
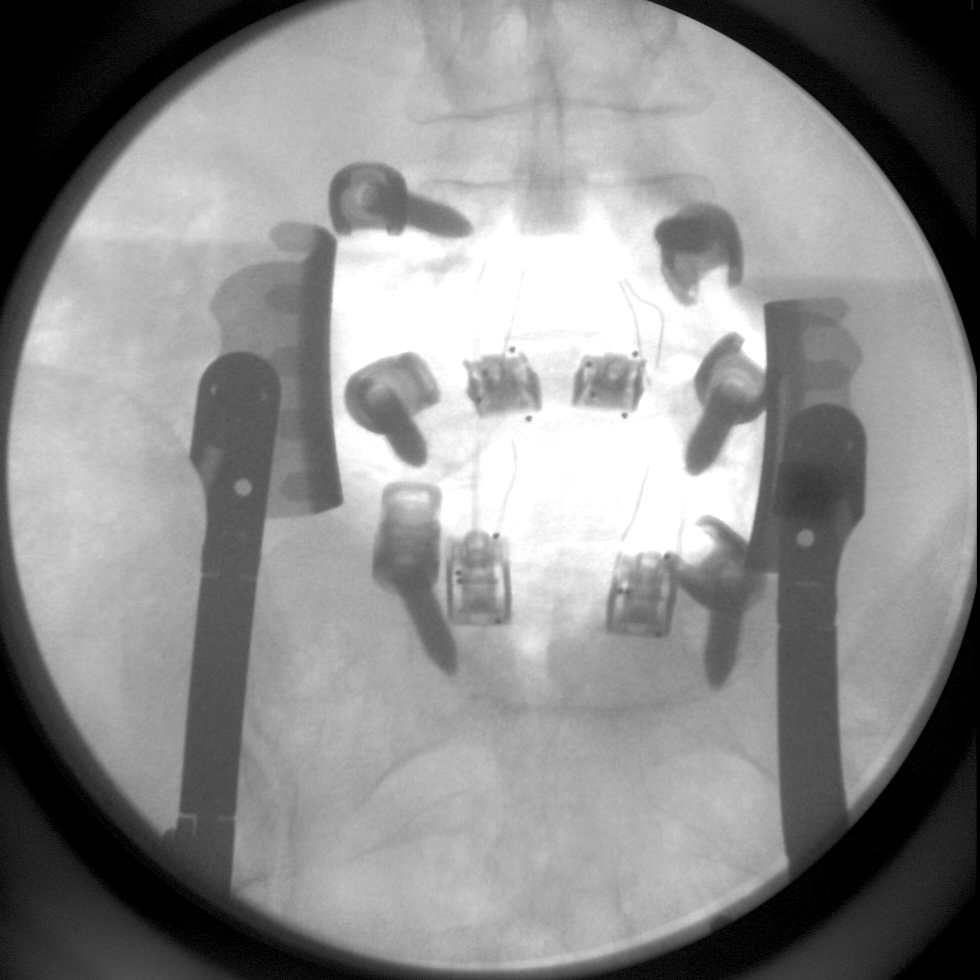

[2 of 2 positions shown; findings below may reference images not displayed]

FINDINGS: Two spot images from intraoperative fluoroscopy document
bilateral pedicle screw placement L4, L5, and S1.  Graft markers
project in the intervening interspaces.
IMPRESSION:

## 2014-04-17 ENCOUNTER — Encounter: Payer: Self-pay | Admitting: Vascular Surgery

## 2014-06-20 ENCOUNTER — Other Ambulatory Visit: Payer: Self-pay

## 2014-06-20 DIAGNOSIS — Z1231 Encounter for screening mammogram for malignant neoplasm of breast: Secondary | ICD-10-CM

## 2014-07-17 ENCOUNTER — Ambulatory Visit
Admission: RE | Admit: 2014-07-17 | Discharge: 2014-07-17 | Disposition: A | Discharge status: Home / Self Care | Payer: PRIVATE HEALTH INSURANCE | Source: Ambulatory Visit

## 2014-07-17 DIAGNOSIS — Z1231 Encounter for screening mammogram for malignant neoplasm of breast: Secondary | ICD-10-CM

## 2015-12-04 ENCOUNTER — Other Ambulatory Visit (HOSPITAL_COMMUNITY): Payer: PRIVATE HEALTH INSURANCE

## 2015-12-04 ENCOUNTER — Ambulatory Visit: Payer: PRIVATE HEALTH INSURANCE | Admitting: Vascular Surgery

## 2016-02-21 ENCOUNTER — Encounter: Payer: Self-pay | Admitting: Vascular Surgery

## 2016-02-25 ENCOUNTER — Other Ambulatory Visit: Payer: Self-pay | Admitting: *Deleted

## 2016-02-25 DIAGNOSIS — I6523 Occlusion and stenosis of bilateral carotid arteries: Secondary | ICD-10-CM

## 2016-02-26 ENCOUNTER — Ambulatory Visit: Payer: PRIVATE HEALTH INSURANCE | Admitting: Vascular Surgery

## 2016-02-26 ENCOUNTER — Ambulatory Visit (HOSPITAL_COMMUNITY): Admission: RE | Admit: 2016-02-26 | Payer: PRIVATE HEALTH INSURANCE | Source: Ambulatory Visit

## 2016-03-10 ENCOUNTER — Telehealth: Payer: Self-pay | Admitting: Vascular Surgery

## 2016-03-10 ENCOUNTER — Encounter: Payer: Self-pay | Admitting: *Deleted

## 2016-03-10 DIAGNOSIS — I773 Arterial fibromuscular dysplasia: Secondary | ICD-10-CM | POA: Insufficient documentation

## 2016-03-10 NOTE — Telephone Encounter (Signed)
Bree from [facility] [phone]) called requesting a MD signature on a Carotid US order we sent on 02-25-16.  Pt lives in [place] and has had this US done in [place] in the past.  I called to verify that the patient definetly wants the US done in [place].  Patient's daughter will have the patient call us back to verify.

## 2016-03-10 NOTE — Telephone Encounter (Signed)
Pt called back and left voice msg saying she will be using [facility] for her [place] tomorrow. I advised her that the order was already faxed to [place].

## 2016-03-27 ENCOUNTER — Encounter: Payer: Self-pay | Admitting: Vascular Surgery

## 2016-04-01 ENCOUNTER — Ambulatory Visit (INDEPENDENT_AMBULATORY_CARE_PROVIDER_SITE_OTHER): Payer: PRIVATE HEALTH INSURANCE | Admitting: Vascular Surgery

## 2016-04-01 ENCOUNTER — Encounter: Payer: Self-pay | Admitting: Vascular Surgery

## 2016-04-01 VITALS — BP 111/76 | HR 78 | Temp 97.0°F | Resp 18 | Ht 65.5 in | Wt 152.5 lb

## 2016-04-01 DIAGNOSIS — I773 Arterial fibromuscular dysplasia: Secondary | ICD-10-CM

## 2016-04-01 NOTE — Addendum Note (Signed)
Addended by: Gabriel Earing on: 04/01/2016 01:11 PM   Modules accepted: Orders

## 2016-04-01 NOTE — Progress Notes (Signed)
[facility] [place]  Patient name: Judith Mcdonald MRN: [id number] DOB: [birth year] Sex: female  REASON FOR VISIT: Follow-up extracranial cerebrovascular occlusive disease  HPI: Judith Mcdonald is a 60 y.o. female today for follow-up. She has a known prior history of imaging studies showing vertebral fibromuscular dysplasia. She's had no neurologic deficits specifically no amaurosis fugax, transient ischemic attack or stroke. She did have a recent duplex at [facility] on 03/11/2016 and I have this for review and for discussion with her has no symptoms suggestive of vertebral insufficiency.  Past Medical History:  Diagnosis Date  . Arthritis   . Complication of anesthesia    Patient has fibromuscular dyplasia of carotid arteries,"neck positioning is important"  . Constipation   . Fibromuscular dysplasia ([place])   . GERD (gastroesophageal reflux disease)   . H/O typhus    at age 25 yrs  . History of recurrent UTIs   . Hx of amenorrhea   . Hx of menorrhagia   . Hx of migraines    with menses  . Hx: UTI (urinary tract infection)   . Hypercholesteremia   . Hypertension    Dr. Brigitte Pulse, [place]  . Hypothyroidism   . Irregular menses   . Mastodynia   . Normal cardiac stress test 2013   Dr Wynonia Lawman  . 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 Hypertension Father   . Cancer Father   . Heart disease Father   . Hyperlipidemia Father   . Heart disease Maternal Grandmother   . Hyperlipidemia Mother   . Hyperlipidemia Brother   . Hypertension Brother     SOCIAL HISTORY: Social History  Substance Use Topics  . Smoking status: Never Smoker  . Smokeless tobacco: Never Used  . Alcohol use Yes     Comment: social    Allergies  Allergen Reactions  . Hydromorphone Hcl Other (See Comments)    Lip tingling, throat "itching"    Current Outpatient  Prescriptions  Medication Sig Dispense Refill  . aspirin EC 81 MG tablet Take 81 mg by mouth daily.    . Calcium Carbonate-Vitamin D (CALCIUM PLUS VITAMIN D PO) Take 1 tablet by mouth daily.    . cholecalciferol (VITAMIN D) 1000 UNITS tablet Take 1,000 Units by mouth daily.     . Estradiol (VAGIFEM) 10 MCG TABS Place 1 tablet vaginally every 30 (thirty) days. Every 3 weeks    . fluticasone (FLONASE) 50 MCG/ACT nasal spray Place 1-2 sprays into the nose daily.    . hydrochlorothiazide (HYDRODIURIL) 25 MG tablet Take 25 mg by mouth daily.    Marland Kitchen levothyroxine (SYNTHROID, LEVOTHROID) 75 MCG tablet Take 75 mcg by mouth daily.    . nitrofurantoin (MACRODANTIN) 50 MG capsule Take 1 capsule (50 mg total) by mouth as needed. 1 capsule 12  . rosuvastatin (CRESTOR) 10 MG tablet Take 5 mg by mouth daily.     . cyclobenzaprine (FLEXERIL) 10 MG tablet Take 1 tablet (10 mg total) by mouth 3 (three) times daily as needed for muscle spasms. (Patient not taking: Reported on 04/01/2016) 80 tablet 1  . esomeprazole (NEXIUM) 40 MG capsule Take 40 mg by mouth daily before breakfast.    . gabapentin (NEURONTIN) 300 MG capsule Take 300 mg by mouth at bedtime.    Marland Kitchen lisinopril (PRINIVIL,ZESTRIL) 10 MG tablet Take 2.5 mg by mouth every other day. Takes 1/4 tab  daily    . oxyCODONE 10 MG TABS Take 1 tablet (10 mg total) by mouth every 4 (four) hours as needed. (Patient not taking: Reported on 04/01/2016) 80 tablet 0  . oxyCODONE 10 MG TABS Take 1 tablet (10 mg total) by mouth every 4 (four) hours as needed. (Patient not taking: Reported on 04/01/2016) 80 tablet 0  . senna-docusate (SENOKOT-S) 8.6-50 MG per tablet Take 1 tablet by mouth daily.     No current facility-administered medications for this visit.     REVIEW OF SYSTEMS:  [X]  denotes positive finding, [ ]  denotes negative finding Cardiac  Comments:  Chest pain or chest pressure:    Shortness of breath upon exertion:    Short of breath when lying flat:      Irregular heart rhythm:        Vascular    Pain in calf, thigh, or hip brought on by ambulation:    Pain in feet at night that wakes you up from your sleep:     Blood clot in your veins:    Leg swelling:           PHYSICAL EXAM: Vitals:   04/01/16 1219 04/01/16 1224  BP: 118/74 111/76  Pulse: 78   Resp: 18   Temp: 97 F (36.1 C)   TempSrc: Oral   SpO2: 96%   Weight: 152 lb 8 oz (69.2 kg)   Height: 5' 5.5" (1.664 m)     GENERAL: The patient is a well-nourished female, in no acute distress. The vital signs are documented above. CARDIOVASCULAR: Right arteries without bruits bilaterally. 2+ radial pulses bilaterally. 2+ dorsalis pedis pulses bilaterally PULMONARY: There is good air exchange  MUSCULOSKELETAL: There are no major deformities or cyanosis. NEUROLOGIC: No focal weakness or paresthesias are detected. SKIN: There are no ulcers or rashes noted. PSYCHIATRIC: The patient has a normal affect.  DATA:  Duplex from [facility] was reviewed and compared our study from 2 years ago. This shows no evidence of carotid stenosis and normal antegrade vertebral flow  MEDICAL ISSUES: Evidence of symptoms related to cerebrovascular occlusive disease. Would not recommend any ongoing active follow-up. Again discussed symptoms with patient he knows to notify us should this occur. Otherwise we'll see her again on an as-needed basis    Rosetta Posner, MD [facility] Tel [phone] Pager [phone]

## 2019-07-09 ENCOUNTER — Ambulatory Visit: Payer: PRIVATE HEALTH INSURANCE | Attending: Internal Medicine

## 2019-07-09 DIAGNOSIS — Z23 Encounter for immunization: Secondary | ICD-10-CM

## 2019-07-09 NOTE — Progress Notes (Signed)
   Covid-19 Vaccination Clinic  Name:  NYALA CUNDIFF    MRN: [id number] DOB: June 28, 1955  07/09/2019  Ms. Otero was observed post Covid-19 immunization for 30 minutes based on pre-vaccination screening without incidence. She was provided with Vaccine Information Sheet and instruction to access the V-Safe system.   Ms. Crook was instructed to call 911 with any severe reactions post vaccine: Marland Kitchen Difficulty breathing  . Swelling of your face and throat  . A fast heartbeat  . A bad rash all over your body  . Dizziness and weakness    Immunizations Administered    Name Date Dose VIS Date Route   Pfizer COVID-19 Vaccine 07/09/2019  3:04 PM 0.3 mL 05/27/2019 Intramuscular   Manufacturer: [place]   Lot: [id number]   [place]: [id number]

## 2019-07-12 ENCOUNTER — Ambulatory Visit: Payer: PRIVATE HEALTH INSURANCE

## 2019-07-16 ENCOUNTER — Ambulatory Visit: Payer: PRIVATE HEALTH INSURANCE

## 2019-07-29 ENCOUNTER — Ambulatory Visit: Payer: PRIVATE HEALTH INSURANCE

## 2019-07-31 ENCOUNTER — Ambulatory Visit: Payer: PRIVATE HEALTH INSURANCE | Attending: Internal Medicine

## 2019-07-31 DIAGNOSIS — Z23 Encounter for immunization: Secondary | ICD-10-CM

## 2019-07-31 NOTE — Progress Notes (Signed)
   Covid-19 Vaccination Clinic  Name:  Judith Mcdonald    MRN: [id number] DOB: Sep 08, 1955  07/31/2019  [name] was observed post Covid-19 immunization for 15 minutes without incidence. She was provided with Vaccine Information Sheet and instruction to access the V-Safe system.   [name] was instructed to call 911 with any severe reactions post vaccine: Marland Kitchen Difficulty breathing  . Swelling of your face and throat  . A fast heartbeat  . A bad rash all over your body  . Dizziness and weakness    Immunizations Administered    Name Date Dose VIS Date Route   Pfizer COVID-19 Vaccine 07/31/2019  1:38 PM 0.3 mL 05/27/2019 Intramuscular   Manufacturer: [place]   Lot: [id number]   [place]: [id number]

## 2019-08-06 ENCOUNTER — Ambulatory Visit: Payer: PRIVATE HEALTH INSURANCE

## 2020-04-17 DIAGNOSIS — Z6829 Body mass index (BMI) 29.0-29.9, adult: Secondary | ICD-10-CM | POA: Diagnosis not present

## 2020-04-17 DIAGNOSIS — Z1231 Encounter for screening mammogram for malignant neoplasm of breast: Secondary | ICD-10-CM | POA: Diagnosis not present

## 2020-04-17 DIAGNOSIS — Z124 Encounter for screening for malignant neoplasm of cervix: Secondary | ICD-10-CM | POA: Diagnosis not present

## 2020-04-17 DIAGNOSIS — M75101 Unspecified rotator cuff tear or rupture of right shoulder, not specified as traumatic: Secondary | ICD-10-CM | POA: Diagnosis not present

## 2020-04-17 DIAGNOSIS — Z01419 Encounter for gynecological examination (general) (routine) without abnormal findings: Secondary | ICD-10-CM | POA: Diagnosis not present

## 2020-04-26 DIAGNOSIS — M7541 Impingement syndrome of right shoulder: Secondary | ICD-10-CM | POA: Diagnosis not present

## 2020-06-26 DIAGNOSIS — M25511 Pain in right shoulder: Secondary | ICD-10-CM | POA: Diagnosis not present

## 2020-07-09 DIAGNOSIS — E039 Hypothyroidism, unspecified: Secondary | ICD-10-CM | POA: Diagnosis not present

## 2020-07-09 DIAGNOSIS — J029 Acute pharyngitis, unspecified: Secondary | ICD-10-CM | POA: Diagnosis not present

## 2020-07-09 DIAGNOSIS — Z1152 Encounter for screening for COVID-19: Secondary | ICD-10-CM | POA: Diagnosis not present

## 2020-07-09 DIAGNOSIS — B349 Viral infection, unspecified: Secondary | ICD-10-CM | POA: Diagnosis not present

## 2020-07-09 DIAGNOSIS — I10 Essential (primary) hypertension: Secondary | ICD-10-CM | POA: Diagnosis not present

## 2020-07-09 DIAGNOSIS — E785 Hyperlipidemia, unspecified: Secondary | ICD-10-CM | POA: Diagnosis not present

## 2020-10-07 DIAGNOSIS — J060 Acute laryngopharyngitis: Secondary | ICD-10-CM | POA: Diagnosis not present

## 2020-10-07 DIAGNOSIS — H66002 Acute suppurative otitis media without spontaneous rupture of ear drum, left ear: Secondary | ICD-10-CM | POA: Diagnosis not present

## 2020-10-07 DIAGNOSIS — J0141 Acute recurrent pansinusitis: Secondary | ICD-10-CM | POA: Diagnosis not present

## 2020-10-07 DIAGNOSIS — J069 Acute upper respiratory infection, unspecified: Secondary | ICD-10-CM | POA: Diagnosis not present

## 2020-12-19 DIAGNOSIS — E559 Vitamin D deficiency, unspecified: Secondary | ICD-10-CM | POA: Diagnosis not present

## 2020-12-19 DIAGNOSIS — I10 Essential (primary) hypertension: Secondary | ICD-10-CM | POA: Diagnosis not present

## 2020-12-19 DIAGNOSIS — E039 Hypothyroidism, unspecified: Secondary | ICD-10-CM | POA: Diagnosis not present

## 2020-12-19 DIAGNOSIS — E785 Hyperlipidemia, unspecified: Secondary | ICD-10-CM | POA: Diagnosis not present

## 2020-12-19 DIAGNOSIS — R7301 Impaired fasting glucose: Secondary | ICD-10-CM | POA: Diagnosis not present

## 2020-12-25 DIAGNOSIS — R3121 Asymptomatic microscopic hematuria: Secondary | ICD-10-CM | POA: Diagnosis not present

## 2020-12-25 DIAGNOSIS — Z1339 Encounter for screening examination for other mental health and behavioral disorders: Secondary | ICD-10-CM | POA: Diagnosis not present

## 2020-12-25 DIAGNOSIS — E785 Hyperlipidemia, unspecified: Secondary | ICD-10-CM | POA: Diagnosis not present

## 2020-12-25 DIAGNOSIS — N281 Cyst of kidney, acquired: Secondary | ICD-10-CM | POA: Diagnosis not present

## 2020-12-25 DIAGNOSIS — L719 Rosacea, unspecified: Secondary | ICD-10-CM | POA: Diagnosis not present

## 2020-12-25 DIAGNOSIS — Z0000 Encounter for general adult medical examination without abnormal findings: Secondary | ICD-10-CM | POA: Diagnosis not present

## 2020-12-25 DIAGNOSIS — Z1331 Encounter for screening for depression: Secondary | ICD-10-CM | POA: Diagnosis not present

## 2020-12-25 DIAGNOSIS — M8580 Other specified disorders of bone density and structure, unspecified site: Secondary | ICD-10-CM | POA: Diagnosis not present

## 2020-12-25 DIAGNOSIS — R7301 Impaired fasting glucose: Secondary | ICD-10-CM | POA: Diagnosis not present

## 2020-12-25 DIAGNOSIS — I10 Essential (primary) hypertension: Secondary | ICD-10-CM | POA: Diagnosis not present

## 2020-12-25 DIAGNOSIS — Z1212 Encounter for screening for malignant neoplasm of rectum: Secondary | ICD-10-CM | POA: Diagnosis not present

## 2020-12-25 DIAGNOSIS — E039 Hypothyroidism, unspecified: Secondary | ICD-10-CM | POA: Diagnosis not present

## 2020-12-25 DIAGNOSIS — Z23 Encounter for immunization: Secondary | ICD-10-CM | POA: Diagnosis not present

## 2020-12-25 DIAGNOSIS — I773 Arterial fibromuscular dysplasia: Secondary | ICD-10-CM | POA: Diagnosis not present

## 2020-12-25 DIAGNOSIS — L739 Follicular disorder, unspecified: Secondary | ICD-10-CM | POA: Diagnosis not present

## 2021-03-23 DIAGNOSIS — Z23 Encounter for immunization: Secondary | ICD-10-CM | POA: Diagnosis not present

## 2021-04-30 DIAGNOSIS — H0014 Chalazion left upper eyelid: Secondary | ICD-10-CM | POA: Diagnosis not present

## 2021-07-11 DIAGNOSIS — L03011 Cellulitis of right finger: Secondary | ICD-10-CM | POA: Diagnosis not present

## 2021-07-11 DIAGNOSIS — Z23 Encounter for immunization: Secondary | ICD-10-CM | POA: Diagnosis not present

## 2021-07-11 DIAGNOSIS — I773 Arterial fibromuscular dysplasia: Secondary | ICD-10-CM | POA: Diagnosis not present

## 2021-07-11 DIAGNOSIS — R42 Dizziness and giddiness: Secondary | ICD-10-CM | POA: Diagnosis not present

## 2021-07-11 DIAGNOSIS — H6592 Unspecified nonsuppurative otitis media, left ear: Secondary | ICD-10-CM | POA: Diagnosis not present

## 2021-07-11 DIAGNOSIS — I10 Essential (primary) hypertension: Secondary | ICD-10-CM | POA: Diagnosis not present

## 2021-07-16 DIAGNOSIS — Z411 Encounter for cosmetic surgery: Secondary | ICD-10-CM | POA: Diagnosis not present

## 2021-07-16 DIAGNOSIS — Z23 Encounter for immunization: Secondary | ICD-10-CM | POA: Diagnosis not present

## 2021-07-16 DIAGNOSIS — L719 Rosacea, unspecified: Secondary | ICD-10-CM | POA: Diagnosis not present

## 2021-10-09 DIAGNOSIS — Z23 Encounter for immunization: Secondary | ICD-10-CM | POA: Diagnosis not present

## 2021-10-14 DIAGNOSIS — H43813 Vitreous degeneration, bilateral: Secondary | ICD-10-CM | POA: Diagnosis not present

## 2021-10-14 DIAGNOSIS — H2513 Age-related nuclear cataract, bilateral: Secondary | ICD-10-CM | POA: Diagnosis not present

## 2021-10-14 DIAGNOSIS — H35372 Puckering of macula, left eye: Secondary | ICD-10-CM | POA: Diagnosis not present

## 2021-12-30 DIAGNOSIS — D229 Melanocytic nevi, unspecified: Secondary | ICD-10-CM | POA: Diagnosis not present

## 2021-12-30 DIAGNOSIS — L578 Other skin changes due to chronic exposure to nonionizing radiation: Secondary | ICD-10-CM | POA: Diagnosis not present

## 2021-12-30 DIAGNOSIS — D1801 Hemangioma of skin and subcutaneous tissue: Secondary | ICD-10-CM | POA: Diagnosis not present

## 2021-12-30 DIAGNOSIS — D485 Neoplasm of uncertain behavior of skin: Secondary | ICD-10-CM | POA: Diagnosis not present

## 2021-12-30 DIAGNOSIS — D225 Melanocytic nevi of trunk: Secondary | ICD-10-CM | POA: Diagnosis not present

## 2021-12-30 DIAGNOSIS — L719 Rosacea, unspecified: Secondary | ICD-10-CM | POA: Diagnosis not present

## 2021-12-30 DIAGNOSIS — L814 Other melanin hyperpigmentation: Secondary | ICD-10-CM | POA: Diagnosis not present

## 2021-12-30 DIAGNOSIS — L821 Other seborrheic keratosis: Secondary | ICD-10-CM | POA: Diagnosis not present

## 2022-01-15 ENCOUNTER — Emergency Department (HOSPITAL_BASED_OUTPATIENT_CLINIC_OR_DEPARTMENT_OTHER)
Admission: EM | Admit: 2022-01-15 | Discharge: 2022-01-15 | Disposition: A | Discharge status: Home / Self Care | Payer: PPO | Attending: Emergency Medicine | Admitting: Emergency Medicine

## 2022-01-15 ENCOUNTER — Encounter (HOSPITAL_BASED_OUTPATIENT_CLINIC_OR_DEPARTMENT_OTHER): Payer: Self-pay

## 2022-01-15 ENCOUNTER — Other Ambulatory Visit: Payer: Self-pay

## 2022-01-15 DIAGNOSIS — R040 Epistaxis: Secondary | ICD-10-CM | POA: Insufficient documentation

## 2022-01-15 DIAGNOSIS — I10 Essential (primary) hypertension: Secondary | ICD-10-CM | POA: Diagnosis not present

## 2022-01-15 DIAGNOSIS — I773 Arterial fibromuscular dysplasia: Secondary | ICD-10-CM | POA: Diagnosis not present

## 2022-01-15 MED ORDER — OXYMETAZOLINE HCL 0.05 % NA SOLN
1.0000 | Freq: Once | NASAL | Status: AC
  Administered 2022-01-15: 1 via NASAL
  Filled 2022-01-15: qty 30

## 2022-01-15 MED ORDER — LIDOCAINE-EPINEPHRINE (PF) 2 %-1:200000 IJ SOLN
10.0000 mL | Freq: Once | INTRAMUSCULAR | Status: AC
  Administered 2022-01-15: 10 mL
  Filled 2022-01-15: qty 20

## 2022-01-15 MED ORDER — OXYMETAZOLINE HCL 0.05 % NA SOLN: 1.0000 | Freq: Two times a day (BID) | NASAL | 0 refills | Status: DC

## 2022-01-15 MED ORDER — SILVER NITRATE-POT NITRATE 75-25 % EX MISC
CUTANEOUS | Status: AC
  Filled 2022-01-15: qty 10

## 2022-01-15 MED ORDER — SILVER NITRATE-POT NITRATE 75-25 % EX MISC
1.0000 | Freq: Once | CUTANEOUS | Status: AC
  Administered 2022-01-15: 1 via TOPICAL

## 2022-01-15 NOTE — ED Provider Notes (Signed)
Emergency Department Provider Note   I have reviewed the triage vital signs and the nursing notes.   HISTORY  Chief Complaint Epistaxis   HPI Judith Mcdonald is a 66 y.o. female with past medical history reviewed below presents to the emergency department with recurrent epistaxis for the past several days.  She does not have a history of nosebleeds which are difficult to control.  She is not anticoagulated.  Denies any trauma to the nose.  She has had episodic nosebleeds lasting sometimes briefly sometimes up to 45 minutes.  She holds pressure and symptoms resolved.  She has had bleeding for the past 2 hours today.  She is not feeling fatigued, short of breath, weakness.  With the nosebleed not stopping she presents to the ED.   Past Medical History:  Diagnosis Date   Arthritis    Complication of anesthesia    Patient has fibromuscular dyplasia of carotid arteries,"neck positioning is important"   Constipation    Fibromuscular dysplasia ([facility])    GERD (gastroesophageal reflux disease)    H/O typhus    at age 89 yrs   History of recurrent UTIs    Hx of amenorrhea    Hx of menorrhagia    Hx of migraines    with menses   Hx: UTI (urinary tract infection)    Hypercholesteremia    Hypertension    Dr. 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 Respiratory: Denies shortness of breath. Gastrointestinal: No abdominal pain. Skin: Negative for rash. Neurological: Negative for headaches.   ____________________________________________   PHYSICAL EXAM:  VITAL SIGNS: ED Triage Vitals  Enc Vitals Group     BP 01/15/22 1355 (!) 169/136     Pulse Rate 01/15/22 1355 76     Resp 01/15/22 1355 (!) 22     Temp 01/15/22 1355 98 F (36.7 C)      Temp src --      SpO2 01/15/22 1355 98 %   Constitutional: Alert and oriented. Well appearing and in no acute distress. Eyes: Conjunctivae are normal.  Head: Atraumatic. Nose: Clot and trickling blood visualized primarily in the left nostril.  After blowing, the patient has an area along the nasal septum of the left nostril which is actively bleeding.  No visible bleeding on the right. Mouth/Throat: Mucous membranes are moist.   Neck: No stridor.   Cardiovascular: Good peripheral circulation.   Respiratory: Normal respiratory effort.   Gastrointestinal: No distention.  Musculoskeletal: No gross deformities of extremities. Neurologic:  Normal speech and language.  Skin:  Skin is warm, dry and intact. No rash noted.  ____________________________________________   PROCEDURES  Procedure(s) performed:   .Epistaxis Management  Date/Time: 01/15/2022 2:33 PM  Performed by: Margette Fast, MD Authorized by: Margette Fast, MD   Consent:    Consent obtained:  Verbal   Consent given by:  Patient   Risks, benefits, and alternatives were discussed: yes     Risks discussed:  Bleeding, infection, nasal injury and pain Universal protocol:    Patient identity confirmed:  Verbally with patient Anesthesia:    Anesthesia method:  Topical application   Topical anesthetic:  Epinephrine and lidocaine gel Procedure details:    Treatment site:  L anterior   Treatment method:  Silver nitrate   Treatment complexity:  Limited   Treatment episode: initial   Post-procedure details:    Assessment:  Bleeding stopped   Procedure completion:  Tolerated well, no immediate complications    ____________________________________________   INITIAL IMPRESSION / ASSESSMENT AND PLAN / ED COURSE  Pertinent labs & imaging results that were available during my care of the patient were reviewed by me and considered in my medical decision making (see chart for details).   This patient is Presenting for  Evaluation of epistaxis, which does require a range of treatment options, and is a complaint that involves a high risk of morbidity and mortality.  The Differential Diagnoses include***.  Critical Interventions-    Medications  silver nitrate applicators applicator 1 Application (has no administration in time range)  oxymetazoline (AFRIN) 0.05 % nasal spray 1 spray (has no administration in time range)  silver nitrate applicators 83-38 % applicator (has no administration in time range)  lidocaine-EPINEPHrine (XYLOCAINE W/EPI) 2 %-1:200000 (PF) injection 10 mL (10 mLs Other Given 01/15/22 1402)    Reassessment after intervention:     I *** Additional Historical Information from ***, as the patient is ***.  I decided to review pertinent External Data, and in summary ***.   Clinical Laboratory Tests Ordered, included   Radiologic Tests Ordered, included ***. I independently interpreted the images and agree with radiology interpretation.   Cardiac Monitor Tracing which shows ***   Social Determinants of Health Risk ***  Consult complete with  Medical Decision Making: Summary: ***  Reevaluation with update and discussion with   ***Considered admission***  Disposition:   ____________________________________________  FINAL CLINICAL IMPRESSION(S) / ED DIAGNOSES  Final diagnoses:  None     NEW OUTPATIENT MEDICATIONS STARTED DURING THIS VISIT:  New Prescriptions   OXYMETAZOLINE (AFRIN NASAL SPRAY) 0.05 % NASAL SPRAY    Place 1 spray into both nostrils 2 (two) times daily.    Note:  This document was prepared using Dragon voice recognition software and may include unintentional dictation errors.  Nanda Quinton, MD, [facility] Emergency Medicine

## 2022-01-15 NOTE — Discharge Instructions (Addendum)

## 2022-01-15 NOTE — ED Triage Notes (Signed)
Pt presents POV from home with bloody nose x1-2 hours ago. 4-5 nosebleeds in 4 days. Pt is scheduled to see Dr. Brigitte Pulse at 2pm today. Her nose began bleeding again this am while visiting her mother.  Pt spit up several blood clots in triage.

## 2022-02-13 DIAGNOSIS — K219 Gastro-esophageal reflux disease without esophagitis: Secondary | ICD-10-CM | POA: Diagnosis not present

## 2022-02-13 DIAGNOSIS — J309 Allergic rhinitis, unspecified: Secondary | ICD-10-CM | POA: Diagnosis not present

## 2022-02-13 DIAGNOSIS — R1312 Dysphagia, oropharyngeal phase: Secondary | ICD-10-CM | POA: Diagnosis not present

## 2022-02-13 DIAGNOSIS — R040 Epistaxis: Secondary | ICD-10-CM | POA: Diagnosis not present

## 2022-03-05 DIAGNOSIS — R7989 Other specified abnormal findings of blood chemistry: Secondary | ICD-10-CM | POA: Diagnosis not present

## 2022-03-05 DIAGNOSIS — E039 Hypothyroidism, unspecified: Secondary | ICD-10-CM | POA: Diagnosis not present

## 2022-03-05 DIAGNOSIS — E785 Hyperlipidemia, unspecified: Secondary | ICD-10-CM | POA: Diagnosis not present

## 2022-03-05 DIAGNOSIS — R7301 Impaired fasting glucose: Secondary | ICD-10-CM | POA: Diagnosis not present

## 2022-03-05 DIAGNOSIS — I10 Essential (primary) hypertension: Secondary | ICD-10-CM | POA: Diagnosis not present

## 2022-03-05 DIAGNOSIS — E559 Vitamin D deficiency, unspecified: Secondary | ICD-10-CM | POA: Diagnosis not present

## 2022-03-11 DIAGNOSIS — R040 Epistaxis: Secondary | ICD-10-CM | POA: Diagnosis not present

## 2022-03-11 DIAGNOSIS — H02831 Dermatochalasis of right upper eyelid: Secondary | ICD-10-CM | POA: Diagnosis not present

## 2022-03-12 DIAGNOSIS — N281 Cyst of kidney, acquired: Secondary | ICD-10-CM | POA: Diagnosis not present

## 2022-03-12 DIAGNOSIS — L299 Pruritus, unspecified: Secondary | ICD-10-CM | POA: Diagnosis not present

## 2022-03-12 DIAGNOSIS — Z1331 Encounter for screening for depression: Secondary | ICD-10-CM | POA: Diagnosis not present

## 2022-03-12 DIAGNOSIS — Z23 Encounter for immunization: Secondary | ICD-10-CM | POA: Diagnosis not present

## 2022-03-12 DIAGNOSIS — I10 Essential (primary) hypertension: Secondary | ICD-10-CM | POA: Diagnosis not present

## 2022-03-12 DIAGNOSIS — M8580 Other specified disorders of bone density and structure, unspecified site: Secondary | ICD-10-CM | POA: Diagnosis not present

## 2022-03-12 DIAGNOSIS — R7301 Impaired fasting glucose: Secondary | ICD-10-CM | POA: Diagnosis not present

## 2022-03-12 DIAGNOSIS — Z1339 Encounter for screening examination for other mental health and behavioral disorders: Secondary | ICD-10-CM | POA: Diagnosis not present

## 2022-03-12 DIAGNOSIS — Z0000 Encounter for general adult medical examination without abnormal findings: Secondary | ICD-10-CM | POA: Diagnosis not present

## 2022-03-12 DIAGNOSIS — E785 Hyperlipidemia, unspecified: Secondary | ICD-10-CM | POA: Diagnosis not present

## 2022-03-12 DIAGNOSIS — Z86010 Personal history of colonic polyps: Secondary | ICD-10-CM | POA: Diagnosis not present

## 2022-03-12 DIAGNOSIS — R0683 Snoring: Secondary | ICD-10-CM | POA: Diagnosis not present

## 2022-03-12 DIAGNOSIS — E559 Vitamin D deficiency, unspecified: Secondary | ICD-10-CM | POA: Diagnosis not present

## 2022-03-25 DIAGNOSIS — R3129 Other microscopic hematuria: Secondary | ICD-10-CM | POA: Diagnosis not present

## 2022-03-25 DIAGNOSIS — N281 Cyst of kidney, acquired: Secondary | ICD-10-CM | POA: Diagnosis not present

## 2022-04-01 DIAGNOSIS — H01111 Allergic dermatitis of right upper eyelid: Secondary | ICD-10-CM | POA: Diagnosis not present

## 2022-04-01 DIAGNOSIS — H01114 Allergic dermatitis of left upper eyelid: Secondary | ICD-10-CM | POA: Diagnosis not present

## 2022-04-22 ENCOUNTER — Encounter: Payer: Self-pay | Admitting: *Deleted

## 2022-04-23 ENCOUNTER — Ambulatory Visit: Payer: PPO | Admitting: Neurology

## 2022-04-23 ENCOUNTER — Encounter: Payer: Self-pay | Admitting: Neurology

## 2022-04-23 VITALS — BP 134/89 | HR 82 | Ht 65.0 in | Wt 166.0 lb

## 2022-04-23 DIAGNOSIS — R030 Elevated blood-pressure reading, without diagnosis of hypertension: Secondary | ICD-10-CM

## 2022-04-23 DIAGNOSIS — R351 Nocturia: Secondary | ICD-10-CM

## 2022-04-23 DIAGNOSIS — R0683 Snoring: Secondary | ICD-10-CM

## 2022-04-23 DIAGNOSIS — E663 Overweight: Secondary | ICD-10-CM | POA: Diagnosis not present

## 2022-04-23 NOTE — Progress Notes (Signed)
Subjective:    Patient ID: Judith Mcdonald is a 66 y.o. female.  HPI   Star Age, MD, PhD [facility] [address] [place], [place] [address]  Dear Dr. Brigitte Mcdonald,  I saw your patient, Judith Mcdonald, upon your kind request in my sleep clinic today for initial consultation of her sleep disorder, in particular concern for underlying obstructive sleep apnea.  The patient is unaccompanied today.  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 I reviewed your office note from 03/12/2022.  Her Epworth sleepiness score is 1 out of 24, fatigue severity score is 33 out of 63.  She has occasional difficulty falling asleep and takes melatonin occasionally.  Bedtime is generally between 10 and 11 PM and rise time around 7 AM.  She has woken up from her own snoring and difficulty exhaling at night.  She lives with her husband, they have 3 grown children.  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  She has severe dry mouth often when she will first wakes up.  She has nocturia about twice per average night and denies recurrent nocturnal or morning headaches.  She drinks caffeine in the form of tea, about 3 cups/day, into the afternoon for the last serving.  She drinks alcohol occasionally, she is a non-smoker.  She had tonsillectomy as a child that septoplasty several years ago and she had nasal cauterization for epistaxis.  She tried Breathe Right strips which did not help.  She does not have a TV in her bedroom.  They have no pets in the household.  Her Past  Medical History Is Significant For: Past Medical History:  Diagnosis Date   Arthritis    Carotid stenosis    Complication of anesthesia    Patient has fibromuscular dyplasia of carotid arteries,"neck positioning is important"   Constipation    Fibromuscular dysplasia (HCC)    GERD (gastroesophageal reflux disease)    H/O typhus    at age 68 yrs   History of recurrent UTIs    Hx of amenorrhea    Hx of menorrhagia    Hx of migraines    with menses   Hx: UTI (urinary tract infection)    Hypercholesteremia    Hypertension    Dr. 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 Thurman Coyer office   CESAREAN SECTION     2x in [place]  05/31/2012   Procedure: HARDWARE REMOVAL;  Surgeon: Elaina Hoops, MD;  Location: [facility];  Service: Neurosurgery;  Laterality: N/A;  Exploration of Lumbar Fusion Lumbar Four to Sacral One  NASAL SEPTUM SURGERY      Her Family History Is Significant For: Family History  Problem Relation Age of Onset   Hyperlipidemia Mother    Dementia Mother    Stroke Father    Hypertension Father    Cancer Father    Heart disease Father    Hyperlipidemia Father    Parkinson's disease Father    Hyperlipidemia Brother    Hypertension Brother    Schizophrenia Brother    COPD Brother    Heart disease Maternal Grandmother    Cancer Paternal Grandfather    Sleep apnea Neg Hx     Her Social History Is Significant For: Social History   Socioeconomic History   Marital status: Married    Spouse name: Not on file   Number of  children: Not on file   Years of education: Not on file   Highest education level: Not on file  Occupational History   Not on file  Tobacco Use   Smoking status: Never   Smokeless tobacco: Never  Substance and Sexual Activity   Alcohol use: Yes    Comment: social   Drug use: No   Sexual activity: Yes    Birth control/protection: None  Other Topics Concern   Not on file  Social History Narrative   Not on file   Social Determinants of Health   Financial Resource Strain: Not on file  Food Insecurity: Not on file  Transportation Needs: Not on file  Physical Activity: Not on file  Stress: Not on file  Social Connections: Not on file    Her Allergies Are:  Allergies  Allergen Reactions   Hydromorphone Hcl Other (See Comments)    Lip tingling, throat "itching"  :   Her Current Medications Are:  Outpatient Encounter Medications as of 04/23/2022  Medication Sig   aspirin EC 81 MG tablet Take 81 mg by mouth daily.   cholecalciferol (VITAMIN D) 1000 UNITS tablet Take 1,000 Units by mouth daily.    fluticasone (FLONASE) 50 MCG/ACT nasal spray Place 1-2 sprays into the nose daily.   hydrochlorothiazide (HYDRODIURIL) 25 MG tablet Take 25 mg by mouth daily.   levothyroxine (SYNTHROID, LEVOTHROID) 75 MCG tablet Take 75 mcg by mouth daily.   oxymetazoline (AFRIN NASAL SPRAY) 0.05 % nasal spray Place 1 spray into both nostrils 2 (two) times daily. (Patient taking differently: Place 1 spray into both nostrils as needed.)   rosuvastatin (CRESTOR) 10 MG tablet Take 5 mg by mouth daily.    VITAMIN D PO Take 300 mg by mouth.   Calcium Carbonate-Vitamin D (CALCIUM PLUS VITAMIN D PO) Take 1 tablet by mouth daily.   cyclobenzaprine (FLEXERIL) 10 MG tablet Take 1 tablet (10 mg total) by mouth 3 (three) times daily as needed for muscle spasms.   esomeprazole (NEXIUM) 40 MG capsule Take 40 mg by mouth daily before breakfast.   Estradiol (VAGIFEM) 10 MCG TABS Place 1 tablet vaginally every 30  (thirty) days. Every 3 weeks   gabapentin (NEURONTIN) 300 MG capsule Take 300 mg by mouth at bedtime.   lisinopril (PRINIVIL,ZESTRIL) 10 MG tablet Take 2.5 mg by mouth every other day. Takes 1/4 tab daily   nitrofurantoin (MACRODANTIN) 50 MG capsule Take 1 capsule (50 mg total) by mouth as needed.   senna-docusate (SENOKOT-S) 8.6-50 MG per tablet Take 1 tablet by mouth daily.   No facility-administered encounter medications on file as of 04/23/2022.  :   Review of Systems:  Out of a complete 14  point review of systems, all are reviewed and negative with the exception of these symptoms as listed below:   Review of Systems  Neurological:        Pt here for sleep consult  Pt snores,fatigue,hypertension, some headaches  Pt denies sleep consult, CPAP machine     ESS:1 FSS:33    Objective:  Neurological Exam  Physical Exam Physical Examination:   Vitals:   04/23/22 1346  BP: 134/89  Mcdonald: 82    General Examination: The patient is a very pleasant 66 y.o. female in no acute distress. She appears well-developed and well-nourished and well groomed.   HEENT: Normocephalic, atraumatic, pupils are equal, round and reactive to light, corrective eyeglasses in place.  Extraocular tracking is good without limitation to gaze excursion or nystagmus noted. Hearing is grossly intact. Face is symmetric with normal facial animation. Speech is clear with no dysarthria noted. There is no hypophonia. There is no lip, neck/head, jaw or voice tremor. Neck is supple with full range of passive and active motion. There are no carotid bruits on auscultation. Oropharynx exam reveals: mild mouth dryness, good dental hygiene and mild airway crowding, due to small airway entry and redundant soft palate, tonsils absent, Mallampati class II.  Neck circumference 14 three-quarter inches, minimal overbite.  Tongue protrudes centrally and palate elevates symmetrically.  Chest: Clear to auscultation without wheezing,  rhonchi or crackles noted.  Heart: S1+S2+0, regular and normal without murmurs, rubs or gallops noted.   Abdomen: Soft, non-tender and non-distended.  Extremities: There is no obvious edema in the distal lower extremities bilaterally.   Skin: Warm and dry without trophic changes noted.   Musculoskeletal: exam reveals no obvious joint deformities.   Neurologically:  Mental status: The patient is awake, alert and oriented in all 4 spheres. Her immediate and remote memory, attention, language skills and fund of knowledge are appropriate. There is no evidence of aphasia, agnosia, apraxia or anomia. Speech is clear with normal prosody and enunciation. Thought process is linear. Mood is normal and affect is normal.  Cranial nerves II - XII are as described above under HEENT exam.  Motor exam: Normal bulk, strength and tone is noted. There is no obvious action or resting tremor.  Fine motor skills and coordination: grossly intact.  Cerebellar testing: No dysmetria or intention tremor. There is no truncal or gait ataxia.  Sensory exam: intact to light touch in the upper and lower extremities.  Gait, station and balance: She stands easily. No veering to one side is noted. No leaning to one side is noted. Posture is age-appropriate and stance is narrow based. Gait shows normal stride length and normal pace. No problems turning are noted.   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 A laboratory attended sleep study is considered gold standard for evaluation of sleep disordered breathing and is recommended at this time and clinically justified.   I had a long chat with the patient  about my findings and the diagnosis of sleep apnea, particularly OSA, its prognosis and treatment options. We talked about medical/conservative treatments, surgical interventions and non-pharmacological approaches for symptom control. I explained, in particular, the risks and ramifications of untreated moderate to severe OSA, especially with respect to developing cardiovascular disease down the road, including congestive heart failure (CHF), difficult to treat hypertension, cardiac arrhythmias (particularly A-fib), neurovascular complications including TIA, stroke and dementia. Even type 2 diabetes has, in part, been linked to untreated OSA. Symptoms of untreated OSA may include (but may not be limited to) daytime sleepiness, nocturia (i.e. frequent nighttime urination), memory problems, mood irritability and suboptimally controlled or worsening mood disorder such as depression and/or anxiety, lack of energy, lack of motivation, physical discomfort, as well as recurrent headaches, especially morning or nocturnal headaches. We talked about the importance of maintaining a healthy lifestyle and striving for healthy weight. In addition, we talked about the importance of striving for and maintaining good sleep hygiene. I recommended the following at this time: sleep study.  I outlined the differences between a laboratory attended sleep study which is considered more comprehensive and accurate over the option of a home sleep test (HST); the latter may lead to underestimation of sleep disordered breathing in some instances and does not help with diagnosing upper  airway resistance syndrome and is not accurate enough to diagnose primary central sleep apnea typically. I explained the different sleep test procedures to the patient in detail and also outlined possible surgical and non-surgical treatment options of OSA, including the use of a pressure airway pressure (PAP) device (ie CPAP, AutoPAP/APAP or BiPAP in certain circumstances), a custom-made dental device (aka oral appliance, which would require a referral to a specialist dentist or orthodontist typically, and is generally speaking not considered a good choice for patients with full dentures or edentulous state), upper airway surgical options, such as traditional UPPP (which is not considered a first-line treatment) or the Inspire device (hypoglossal nerve stimulator, which would involve a referral for consultation with an ENT surgeon, after careful selection, following inclusion criteria). I explained the PAP treatment option to the patient in detail, as this is generally considered first-line treatment.  The patient indicated that she would be willing to try PAP therapy, if the need arises. I explained the importance of being compliant with PAP treatment, not only for insurance purposes but primarily to improve patient's symptoms symptoms, and for the patient's long term health benefit, including to reduce Her cardiovascular risks longer-term.    We will pick up our discussion about the next steps and treatment options after testing.  We will keep her posted as to the test results by phone call and/or MyChart messaging where possible.  We will plan to follow-up in sleep clinic accordingly as well.  I answered all her questions today and the patient was in agreement.   I encouraged her to call with any interim questions, concerns, problems or updates or email [place] through [place].  Generally speaking, sleep test authorizations may take up to 2 weeks, sometimes less, sometimes longer, the patient is encouraged to get  in touch with [place] if they do not hear back from the sleep lab staff directly within the next 2 weeks.  Thank you very much for allowing me to participate in the care of this nice patient. If I can be of any further assistance to you please do not hesitate to call me at [phone].  Sincerely,   Star Age, MD, PhD

## 2022-04-23 NOTE — Patient Instructions (Signed)

## 2022-04-24 ENCOUNTER — Telehealth: Payer: Self-pay | Admitting: Neurology

## 2022-04-24 NOTE — Telephone Encounter (Signed)
HTA pending faxed notes 

## 2022-04-29 DIAGNOSIS — R3129 Other microscopic hematuria: Secondary | ICD-10-CM | POA: Diagnosis not present

## 2022-04-29 DIAGNOSIS — N952 Postmenopausal atrophic vaginitis: Secondary | ICD-10-CM | POA: Diagnosis not present

## 2022-04-29 DIAGNOSIS — N281 Cyst of kidney, acquired: Secondary | ICD-10-CM | POA: Diagnosis not present

## 2022-05-05 DIAGNOSIS — J069 Acute upper respiratory infection, unspecified: Secondary | ICD-10-CM | POA: Diagnosis not present

## 2022-05-05 DIAGNOSIS — R051 Acute cough: Secondary | ICD-10-CM | POA: Diagnosis not present

## 2022-05-05 DIAGNOSIS — I10 Essential (primary) hypertension: Secondary | ICD-10-CM | POA: Diagnosis not present

## 2022-05-12 NOTE — Telephone Encounter (Signed)
NPSG- HTA auth: [id number] (exp. 04/24/22 to 07/25/22)   Patient is scheduled at [facility] for 05/23/22 at 8 pm.  Mailed packet to the patient.

## 2022-05-13 NOTE — Telephone Encounter (Signed)
Patient called and is not able to do this day with everything going on with the Holidays.  She is r/s for 07/21/22 at 9 pm.  Mailed new packet to the patient.

## 2022-07-21 ENCOUNTER — Ambulatory Visit (INDEPENDENT_AMBULATORY_CARE_PROVIDER_SITE_OTHER): Payer: PPO | Admitting: Neurology

## 2022-07-21 DIAGNOSIS — G4720 Circadian rhythm sleep disorder, unspecified type: Secondary | ICD-10-CM

## 2022-07-21 DIAGNOSIS — G4733 Obstructive sleep apnea (adult) (pediatric): Secondary | ICD-10-CM

## 2022-07-21 DIAGNOSIS — R0683 Snoring: Secondary | ICD-10-CM | POA: Diagnosis not present

## 2022-07-21 DIAGNOSIS — R351 Nocturia: Secondary | ICD-10-CM

## 2022-07-21 DIAGNOSIS — E663 Overweight: Secondary | ICD-10-CM

## 2022-07-21 DIAGNOSIS — R030 Elevated blood-pressure reading, without diagnosis of hypertension: Secondary | ICD-10-CM

## 2022-07-21 DIAGNOSIS — G4761 Periodic limb movement disorder: Secondary | ICD-10-CM

## 2022-07-24 DIAGNOSIS — R051 Acute cough: Secondary | ICD-10-CM | POA: Diagnosis not present

## 2022-07-24 DIAGNOSIS — R0981 Nasal congestion: Secondary | ICD-10-CM | POA: Diagnosis not present

## 2022-07-28 NOTE — Procedures (Unsigned)
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 Her Epworth sleepiness score is 1 out of 24, fatigue severity score is 33 out of 63.     EEG: Review of the EEG showed no abnormal electrical discharges and symmetrical bihemispheric findings.    EKG: The EKG revealed normal sinus rhythm (NSR).   AUDIO/VIDEO REVIEW: The audio and video review did not show any abnormal or unusual behaviors, movements, phonations or vocalizations. The patient took 1 restroom break.  Mild to moderate snoring was detected.  POST-STUDY QUESTIONNAIRE: Post study, the patient indicated, that sleep was better than usual.   IMPRESSION:   Mild obstructive Sleep Apnea (OSA) PLMD (periodic limb movement disorder [of sleep]) Dysfunctions associated with sleep stages or arousal from sleep  RECOMMENDATIONS:   This study demonstrates overall mild/borderline obstructive sleep apnea, more pronounced during REM sleep with a total AHI of 5.3/h, REM AHI of 19.1/h, supine AHI of 7.7/h, O2 nadir 85%.  Positive airway pressure treatment with a CPAP or AutoPap device is not imperative but can be considered to alleviate symptoms as, treatment can be achieved in the form of autoPAP therapy at home. A full-night CPAP titration study would allow optimization of therapy, if needed, down the road. Other treatment options may include avoidance of supine sleep position along with weight loss, or the use of an oral appliance in selected patients. Please note, that untreated obstructive sleep  apnea may carry additional perioperative morbidity. Patients with significant obstructive sleep apnea should receive perioperative PAP therapy and the surgeons and particularly the anesthesiologist should be informed of the diagnosis and the severity of the sleep disordered breathing. Mild PLMs (periodic limb movements of sleep) were noted during this study without significant arousals; clinical correlation is recommended.  This study shows some sleep fragmentation and mildly abnormal sleep stage percentages; these are nonspecific findings and per se do not signify an intrinsic sleep disorder or a cause for the patient's sleep-related symptoms. Causes include (but are not limited to) the first night effect of the sleep study, circadian rhythm disturbances, medication effect or an underlying mood disorder or medical problem.  The patient should be cautioned not to drive, work at heights, or operate dangerous or heavy equipment when tired or sleepy. Review and reiteration of good sleep hygiene measures should be pursued with any patient. The patient will be seen in follow-up as necessary at [facility]. The patient and the referring provider will be notified of the test results.   I certify that I have reviewed the entire raw data recording prior to the issuance of this report in accordance with the Standards of Accreditation of the American Academy of Sleep Medicine (AASM).  Star Age, MD, PhD Medical Director, [facility]) [place], ABPN (Neurology and Sleep)   Technical Report:   ***

## 2022-07-29 ENCOUNTER — Telehealth: Payer: Self-pay | Admitting: *Deleted

## 2022-07-29 ENCOUNTER — Encounter: Payer: Self-pay | Admitting: *Deleted

## 2022-07-29 DIAGNOSIS — G4733 Obstructive sleep apnea (adult) (pediatric): Secondary | ICD-10-CM

## 2022-07-29 NOTE — Telephone Encounter (Signed)
Spoke with patient and discussed sleep study results. Patient is amenable to proceeding with autopap. Her questions were answered. She does not have a preference for which DME. Will refer to [facility]. Patient is aware of the insurance compliance requirements which includes using the machine at least 4 hours at night and also being seen in our office for an initial follow-up appointment between 30 and 90 days after setup. She scheduled f/u appt for 5/13 at 9:00 AM arrival 830. Will mail letter to pt's home for summary as she does not use mychart. Patient verbalized appreciation for the call.  Order written for autopap. Referral faxed to [facility]. Received a receipt of confirmation. Letter mailed to patient. Report sent to referring provider.

## 2022-07-29 NOTE — Telephone Encounter (Signed)
-----   Message from Star Age, MD sent at 07/29/2022  8:29 AM EST ----- Patient referred by Dr. Brigitte Pulse, seen by me on 04/23/22, diagnostic PSG on 07/21/22.    Please call and notify the patient that the recent sleep study showed mild obstructive sleep apnea. It may be worth treating to see if she feels better after treatment. If she is agreeable, we can start her on home autoPAP, which means, that we don't have to bring her back for a second sleep study with CPAP, but will let him try an autoPAP machine at home, through a DME company (of her choice, or as per insurance requirement). The DME representative will educate her on how to use the machine, how to put the mask on, etc. I have not put an order in yet. If she agrees, place an order in the chart: autoPAP 5-10 cm, mask of choice, sized to fit.  Thanks,   Star Age, MD, PhD [facility])

## 2022-08-19 DIAGNOSIS — I10 Essential (primary) hypertension: Secondary | ICD-10-CM | POA: Diagnosis not present

## 2022-08-19 DIAGNOSIS — G4733 Obstructive sleep apnea (adult) (pediatric): Secondary | ICD-10-CM | POA: Diagnosis not present

## 2022-08-26 DIAGNOSIS — N951 Menopausal and female climacteric states: Secondary | ICD-10-CM | POA: Diagnosis not present

## 2022-08-26 DIAGNOSIS — N952 Postmenopausal atrophic vaginitis: Secondary | ICD-10-CM | POA: Diagnosis not present

## 2022-08-26 DIAGNOSIS — Z1231 Encounter for screening mammogram for malignant neoplasm of breast: Secondary | ICD-10-CM | POA: Diagnosis not present

## 2022-08-26 DIAGNOSIS — Z01419 Encounter for gynecological examination (general) (routine) without abnormal findings: Secondary | ICD-10-CM | POA: Diagnosis not present

## 2022-08-26 DIAGNOSIS — G4730 Sleep apnea, unspecified: Secondary | ICD-10-CM | POA: Diagnosis not present

## 2022-09-03 DIAGNOSIS — G4733 Obstructive sleep apnea (adult) (pediatric): Secondary | ICD-10-CM | POA: Diagnosis not present

## 2022-09-03 DIAGNOSIS — I10 Essential (primary) hypertension: Secondary | ICD-10-CM | POA: Diagnosis not present

## 2022-09-19 DIAGNOSIS — G4733 Obstructive sleep apnea (adult) (pediatric): Secondary | ICD-10-CM | POA: Diagnosis not present

## 2022-09-19 DIAGNOSIS — I10 Essential (primary) hypertension: Secondary | ICD-10-CM | POA: Diagnosis not present

## 2022-10-19 DIAGNOSIS — G4733 Obstructive sleep apnea (adult) (pediatric): Secondary | ICD-10-CM | POA: Diagnosis not present

## 2022-10-19 DIAGNOSIS — I10 Essential (primary) hypertension: Secondary | ICD-10-CM | POA: Diagnosis not present

## 2022-10-23 NOTE — Progress Notes (Signed)
PATIENT: Judith Mcdonald DOB: 05/21/1956  REASON FOR VISIT: follow up HISTORY FROM: patient  Chief Complaint  Patient presents with   Follow-up    Pt in room 2. Here for cpap follow up. Pt said she doing well, sleeps much better,     HISTORY OF PRESENT ILLNESS:  10/27/22 ALL:  Judith Mcdonald is a 67 y.o. female here today for follow up for OSA on CPAP.  She was seen in consult with Dr Frances Furbish 04/2022 for concerns of snoring, dry mouth and nocturia. PSG 07/2022 showed mild OSA with total AHI 5.3/h, REM AHI 19/h and 02 nadir 85%. AutoPAP preferred by patient. She reports doing well. She rarely snores. She is sleeping better. Doesn't wake as much during the night. Wakes refreshed. She is tolerating it well. She is using a nasal mask and would like to try a nasal pillow. Otherwise, doing great.     HISTORY: (copied from Dr Teofilo Pod previous note)  Dear Dr. Clelia Croft,   I saw your patient, Judith Mcdonald, upon your kind request in my sleep clinic today for initial consultation of her sleep disorder, in particular concern for underlying obstructive sleep apnea.  The patient is unaccompanied today.  As you know, Ms. 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 I reviewed your office note from 03/12/2022.  Her Epworth sleepiness score is 1 out of 24, fatigue severity score is 33 out of 63.  She has occasional difficulty falling asleep and takes melatonin occasionally.  Bedtime is generally between 10 and 11 PM and rise time around 7 AM.  She has woken up from her own snoring and difficulty exhaling at night.  She lives with her husband, they have  3 grown children.  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  She has severe dry mouth often when she will first wakes up.  She has nocturia about twice per average night and denies recurrent nocturnal or morning headaches.  She drinks caffeine in the form of tea, about 3 cups/day, into the afternoon for the last serving.  She drinks alcohol occasionally, she is a non-smoker.  She had tonsillectomy as a child that septoplasty several years ago and she had nasal cauterization for epistaxis.  She tried Breathe Right strips which did not help.  She does not have a TV in her bedroom.  They have no pets in the household.   REVIEW OF SYSTEMS: Out of a complete 14 system review of symptoms, the patient complains only of the following symptoms, none and all other reviewed systems are negative.  ESS: 1/24  ALLERGIES: Allergies  Allergen Reactions   Hydromorphone Hcl Other (See Comments)    Lip tingling, throat "itching"    HOME MEDICATIONS: Outpatient Medications Prior to Visit  Medication Sig Dispense Refill   aspirin EC 81 MG tablet Take 81 mg by mouth daily.     cholecalciferol (VITAMIN D) 1000 UNITS tablet Take 1,000 Units by mouth daily. 2,000 units daily     fluticasone (FLONASE) 50 MCG/ACT nasal spray Place 1-2 sprays into the nose daily.     hydrochlorothiazide (HYDRODIURIL) 25 MG tablet Take 25 mg by mouth daily.     levothyroxine (SYNTHROID,  LEVOTHROID) 75 MCG tablet Take 75 mcg by mouth daily.     rosuvastatin (CRESTOR) 10 MG tablet Take 5 mg by mouth daily.      Calcium Carbonate-Vitamin D (CALCIUM PLUS VITAMIN D PO) Take 1 tablet by mouth daily. (Patient not taking: Reported on 10/27/2022)     cyclobenzaprine (FLEXERIL) 10 MG tablet Take 1 tablet (10 mg total) by mouth 3 (three) times daily as needed for muscle spasms. (Patient not taking: Reported on 10/27/2022) 80 tablet 1   esomeprazole (NEXIUM) 40 MG capsule Take 40 mg by mouth daily before  breakfast. (Patient not taking: Reported on 10/27/2022)     Estradiol (VAGIFEM) 10 MCG TABS Place 1 tablet vaginally every 30 (thirty) days. Every 3 weeks (Patient not taking: Reported on 10/27/2022)     gabapentin (NEURONTIN) 300 MG capsule Take 300 mg by mouth at bedtime. (Patient not taking: Reported on 10/27/2022)     lisinopril (PRINIVIL,ZESTRIL) 10 MG tablet Take 2.5 mg by mouth every other day. Takes 1/4 tab daily (Patient not taking: Reported on 10/27/2022)     nitrofurantoin (MACRODANTIN) 50 MG capsule Take 1 capsule (50 mg total) by mouth as needed. (Patient not taking: Reported on 10/27/2022) 1 capsule 12   oxymetazoline (AFRIN NASAL SPRAY) 0.05 % nasal spray Place 1 spray into both nostrils 2 (two) times daily. (Patient not taking: Reported on 10/27/2022) 30 mL 0   senna-docusate (SENOKOT-S) 8.6-50 MG per tablet Take 1 tablet by mouth daily. (Patient not taking: Reported on 10/27/2022)     VITAMIN D PO Take 300 mg by mouth. (Patient not taking: Reported on 10/27/2022)     No facility-administered medications prior to visit.    PAST MEDICAL HISTORY: Past Medical History:  Diagnosis Date   Arthritis    Carotid stenosis    Complication of anesthesia    Patient has fibromuscular dyplasia of carotid arteries,"neck positioning is important"   Constipation    Fibromuscular dysplasia (HCC)    GERD (gastroesophageal reflux disease)    H/O typhus    at age 83 yrs   History of recurrent UTIs    Hx of amenorrhea    Hx of menorrhagia    Hx of migraines    with menses   Hx: UTI (urinary tract infection)    Hypercholesteremia    Hypertension    Dr. 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   CARDIOVASCULAR STRESS TEST     done at Dr. York Spaniel office   CESAREAN SECTION     2x in 1984 & 1986   HARDWARE REMOVAL  05/31/2012   Procedure: HARDWARE REMOVAL;  Surgeon: Mariam Dollar, MD;  Location: [facility];  Service: Neurosurgery;  Laterality: N/A;  Exploration of Lumbar Fusion Lumbar Four to Sacral One    NASAL SEPTUM SURGERY      FAMILY HISTORY: Family History  Problem Relation Age of Onset   Hyperlipidemia Mother    Dementia Mother    Stroke Father    Hypertension Father    Cancer Father    Heart  disease Father    Hyperlipidemia Father    Parkinson's disease Father    Hyperlipidemia Brother    Hypertension Brother    Schizophrenia Brother    COPD Brother    Heart disease Maternal Grandmother    Cancer Paternal Grandfather    Sleep apnea Neg Hx     SOCIAL HISTORY: Social History   Socioeconomic History   Marital status: Married    Spouse name: Not on file   Number of children: Not on file   Years of education: Not on file   Highest education level: Not on file  Occupational History   Not on file  Tobacco Use   Smoking status: Never   Smokeless tobacco: Never  Substance and Sexual Activity   Alcohol use: Yes    Comment: social   Drug use: No   Sexual activity: Yes    Birth control/protection: None  Other Topics Concern   Not on file  Social History Narrative   Not on file   Social Determinants of Health   Financial Resource Strain: Not on file  Food Insecurity: Not on file  Transportation Needs: Not on file  Physical Activity: Not on file  Stress: Not on file  Social Connections: Not on file  Intimate Partner Violence: Not on file     PHYSICAL EXAM  Vitals:   10/27/22 0835  BP: 117/77  Pulse: 72  Weight: 165 lb (74.8 kg)  Height: 5\' 5"  (1.651 m)   Body mass index is 27.46 kg/m.  Generalized: Well developed, in no acute distress  Cardiology: normal rate and rhythm, no murmur  noted Respiratory: clear to auscultation bilaterally  Neurological examination  Mentation: Alert oriented to time, place, history taking. Follows all commands speech and language fluent Cranial nerve II-XII: Pupils were equal round reactive to light. Extraocular movements were full, visual field were full  Motor: The motor testing reveals 5 over 5 strength of all 4 extremities. Good symmetric motor tone is noted throughout.  Gait and station: Gait is normal.    DIAGNOSTIC DATA (LABS, IMAGING, TESTING) - I reviewed patient records, labs, notes, testing and imaging myself where available.      No data to display           Lab Results  Component Value Date   WBC 7.4 05/28/2012   HGB 10.7 (L) 05/28/2012   HCT 32.4 (L) 05/28/2012   MCV 91.3 05/28/2012   PLT 530 (H) 05/28/2012      Component Value Date/Time   NA 137 05/28/2012 1344   K 3.2 (L) 05/28/2012 1344   CL 97 05/28/2012 1344   CO2 30 05/28/2012 1344   GLUCOSE 92 05/28/2012 1344   BUN 16 05/28/2012 1344   CREATININE 0.81 05/28/2012 1344   CALCIUM 9.5 05/28/2012 1344   PROT 7.1 09/25/2011 1033   ALBUMIN 3.8 09/25/2011 1033   AST 24 09/25/2011 1033   ALT 27 09/25/2011 1033   ALKPHOS 70 09/25/2011 1033   BILITOT 0.3 09/25/2011 1033   GFRNONAA 79 (L) 05/28/2012 1344   GFRAA >90 05/28/2012 1344   No results found for: "CHOL", "HDL", "LDLCALC", "LDLDIRECT", "TRIG", "CHOLHDL" No results found for: "HGBA1C" No results found for: "VITAMINB12" No results found for: "TSH"   ASSESSMENT AND PLAN 67 y.o. year old female  has a past medical history of Arthritis, Carotid stenosis, Complication of anesthesia, Constipation, Fibromuscular dysplasia (HCC), GERD (gastroesophageal reflux disease), H/O typhus, History of recurrent UTIs, amenorrhea, menorrhagia, migraines, UTI (urinary tract infection), Hypercholesteremia,  Hypertension, Hypothyroidism, Irregular menses, Mastodynia, Normal cardiac stress test (06/17/2011), Osteopenia,  Tuberculosis (06/16/1984), and Vitamin D deficiency. here with     ICD-10-CM   1. OSA (obstructive sleep apnea)  G47.33 For home use only DME continuous positive airway pressure (CPAP)       Judith Mcdonald is doing well on CPAP therapy. Compliance report reveals excellent compliance. She was encouraged to continue using CPAP nightly and for greater than 4 hours each night. We will update supply orders as indicated. Risks of untreated sleep apnea review and education materials provided. Healthy lifestyle habits encouraged. She will follow up in 1 year, sooner if needed. She verbalizes understanding and agreement with this plan.    Orders Placed This Encounter  Procedures   For home use only DME continuous positive airway pressure (CPAP)    Mask refitting, would like to try nasal pillow    Order Specific Question:   Length of Need    Answer:   Lifetime    Order Specific Question:   Patient has OSA or probable OSA    Answer:   Yes    Order Specific Question:   Is the patient currently using CPAP in the home    Answer:   Yes    Order Specific Question:   Settings    Answer:   Other see comments    Order Specific Question:   CPAP supplies needed    Answer:   Mask, headgear, cushions, filters, heated tubing and water chamber     No orders of the defined types were placed in this encounter.     Shawnie Dapper, FNP-C 10/27/2022, 9:17 AM [facility] [address] [place], [place] [address] [phone]

## 2022-10-23 NOTE — Patient Instructions (Addendum)
Please continue using your CPAP regularly. While your insurance requires that you use CPAP at least 4 hours each night on 70% of the nights, I recommend, that you not skip any nights and use it throughout the night if you can. Getting used to CPAP and staying with the treatment long term does take time and patience and discipline. Untreated obstructive sleep apnea when it is moderate to severe can have an adverse impact on cardiovascular health and raise her risk for heart disease, arrhythmias, hypertension, congestive heart failure, stroke and diabetes. Untreated obstructive sleep apnea causes sleep disruption, nonrestorative sleep, and sleep deprivation. This can have an impact on your day to day functioning and cause daytime sleepiness and impairment of cognitive function, memory loss, mood disturbance, and problems focussing. Using CPAP regularly can improve these symptoms.  We will update supply orders, today. I will send you for mask refitting. Please contact DME: [facility] Ph: [phone] to discuss.   Follow up in 1 year

## 2022-10-27 ENCOUNTER — Ambulatory Visit: Payer: PPO | Admitting: Family Medicine

## 2022-10-27 ENCOUNTER — Encounter: Payer: Self-pay | Admitting: Family Medicine

## 2022-10-27 VITALS — BP 117/77 | HR 72 | Ht 65.0 in | Wt 165.0 lb

## 2022-10-27 DIAGNOSIS — G4733 Obstructive sleep apnea (adult) (pediatric): Secondary | ICD-10-CM | POA: Diagnosis not present

## 2022-11-19 DIAGNOSIS — G4733 Obstructive sleep apnea (adult) (pediatric): Secondary | ICD-10-CM | POA: Diagnosis not present

## 2022-11-19 DIAGNOSIS — I10 Essential (primary) hypertension: Secondary | ICD-10-CM | POA: Diagnosis not present

## 2022-11-20 DIAGNOSIS — I10 Essential (primary) hypertension: Secondary | ICD-10-CM | POA: Diagnosis not present

## 2022-11-20 DIAGNOSIS — G4733 Obstructive sleep apnea (adult) (pediatric): Secondary | ICD-10-CM | POA: Diagnosis not present

## 2022-12-08 DIAGNOSIS — D23111 Other benign neoplasm of skin of right upper eyelid, including canthus: Secondary | ICD-10-CM | POA: Diagnosis not present

## 2022-12-11 DIAGNOSIS — L281 Prurigo nodularis: Secondary | ICD-10-CM | POA: Diagnosis not present

## 2022-12-11 DIAGNOSIS — D485 Neoplasm of uncertain behavior of skin: Secondary | ICD-10-CM | POA: Diagnosis not present

## 2022-12-19 DIAGNOSIS — G4733 Obstructive sleep apnea (adult) (pediatric): Secondary | ICD-10-CM | POA: Diagnosis not present

## 2022-12-19 DIAGNOSIS — I10 Essential (primary) hypertension: Secondary | ICD-10-CM | POA: Diagnosis not present

## 2022-12-24 DIAGNOSIS — S60211A Contusion of right wrist, initial encounter: Secondary | ICD-10-CM | POA: Diagnosis not present

## 2022-12-24 DIAGNOSIS — M67432 Ganglion, left wrist: Secondary | ICD-10-CM | POA: Diagnosis not present

## 2022-12-31 DIAGNOSIS — L814 Other melanin hyperpigmentation: Secondary | ICD-10-CM | POA: Diagnosis not present

## 2022-12-31 DIAGNOSIS — L739 Follicular disorder, unspecified: Secondary | ICD-10-CM | POA: Diagnosis not present

## 2022-12-31 DIAGNOSIS — D1801 Hemangioma of skin and subcutaneous tissue: Secondary | ICD-10-CM | POA: Diagnosis not present

## 2022-12-31 DIAGNOSIS — L309 Dermatitis, unspecified: Secondary | ICD-10-CM | POA: Diagnosis not present

## 2022-12-31 DIAGNOSIS — B351 Tinea unguium: Secondary | ICD-10-CM | POA: Diagnosis not present

## 2022-12-31 DIAGNOSIS — L821 Other seborrheic keratosis: Secondary | ICD-10-CM | POA: Diagnosis not present

## 2022-12-31 DIAGNOSIS — L578 Other skin changes due to chronic exposure to nonionizing radiation: Secondary | ICD-10-CM | POA: Diagnosis not present

## 2022-12-31 DIAGNOSIS — D229 Melanocytic nevi, unspecified: Secondary | ICD-10-CM | POA: Diagnosis not present

## 2023-01-19 DIAGNOSIS — I10 Essential (primary) hypertension: Secondary | ICD-10-CM | POA: Diagnosis not present

## 2023-01-19 DIAGNOSIS — G4733 Obstructive sleep apnea (adult) (pediatric): Secondary | ICD-10-CM | POA: Diagnosis not present

## 2023-01-28 DIAGNOSIS — I10 Essential (primary) hypertension: Secondary | ICD-10-CM | POA: Diagnosis not present

## 2023-01-28 DIAGNOSIS — G4733 Obstructive sleep apnea (adult) (pediatric): Secondary | ICD-10-CM | POA: Diagnosis not present

## 2023-02-19 DIAGNOSIS — G4733 Obstructive sleep apnea (adult) (pediatric): Secondary | ICD-10-CM | POA: Diagnosis not present

## 2023-02-19 DIAGNOSIS — I10 Essential (primary) hypertension: Secondary | ICD-10-CM | POA: Diagnosis not present

## 2023-03-21 DIAGNOSIS — I10 Essential (primary) hypertension: Secondary | ICD-10-CM | POA: Diagnosis not present

## 2023-03-21 DIAGNOSIS — G4733 Obstructive sleep apnea (adult) (pediatric): Secondary | ICD-10-CM | POA: Diagnosis not present

## 2023-04-03 DIAGNOSIS — E785 Hyperlipidemia, unspecified: Secondary | ICD-10-CM | POA: Diagnosis not present

## 2023-04-03 DIAGNOSIS — Z23 Encounter for immunization: Secondary | ICD-10-CM | POA: Diagnosis not present

## 2023-04-03 DIAGNOSIS — R7301 Impaired fasting glucose: Secondary | ICD-10-CM | POA: Diagnosis not present

## 2023-04-03 DIAGNOSIS — M8580 Other specified disorders of bone density and structure, unspecified site: Secondary | ICD-10-CM | POA: Diagnosis not present

## 2023-04-03 DIAGNOSIS — E559 Vitamin D deficiency, unspecified: Secondary | ICD-10-CM | POA: Diagnosis not present

## 2023-04-03 DIAGNOSIS — Z1339 Encounter for screening examination for other mental health and behavioral disorders: Secondary | ICD-10-CM | POA: Diagnosis not present

## 2023-04-03 DIAGNOSIS — I773 Arterial fibromuscular dysplasia: Secondary | ICD-10-CM | POA: Diagnosis not present

## 2023-04-03 DIAGNOSIS — E039 Hypothyroidism, unspecified: Secondary | ICD-10-CM | POA: Diagnosis not present

## 2023-04-03 DIAGNOSIS — Z1331 Encounter for screening for depression: Secondary | ICD-10-CM | POA: Diagnosis not present

## 2023-04-03 DIAGNOSIS — Z860101 Personal history of adenomatous and serrated colon polyps: Secondary | ICD-10-CM | POA: Diagnosis not present

## 2023-04-03 DIAGNOSIS — K219 Gastro-esophageal reflux disease without esophagitis: Secondary | ICD-10-CM | POA: Diagnosis not present

## 2023-04-03 DIAGNOSIS — R82998 Other abnormal findings in urine: Secondary | ICD-10-CM | POA: Diagnosis not present

## 2023-04-03 DIAGNOSIS — Z0000 Encounter for general adult medical examination without abnormal findings: Secondary | ICD-10-CM | POA: Diagnosis not present

## 2023-04-03 DIAGNOSIS — I10 Essential (primary) hypertension: Secondary | ICD-10-CM | POA: Diagnosis not present

## 2023-04-21 DIAGNOSIS — I10 Essential (primary) hypertension: Secondary | ICD-10-CM | POA: Diagnosis not present

## 2023-04-21 DIAGNOSIS — G4733 Obstructive sleep apnea (adult) (pediatric): Secondary | ICD-10-CM | POA: Diagnosis not present

## 2023-05-05 DIAGNOSIS — I10 Essential (primary) hypertension: Secondary | ICD-10-CM | POA: Diagnosis not present

## 2023-05-21 DIAGNOSIS — G4733 Obstructive sleep apnea (adult) (pediatric): Secondary | ICD-10-CM | POA: Diagnosis not present

## 2023-05-21 DIAGNOSIS — I10 Essential (primary) hypertension: Secondary | ICD-10-CM | POA: Diagnosis not present

## 2023-05-29 DIAGNOSIS — I10 Essential (primary) hypertension: Secondary | ICD-10-CM | POA: Diagnosis not present

## 2023-05-29 DIAGNOSIS — G4733 Obstructive sleep apnea (adult) (pediatric): Secondary | ICD-10-CM | POA: Diagnosis not present

## 2023-06-09 DIAGNOSIS — M4726 Other spondylosis with radiculopathy, lumbar region: Secondary | ICD-10-CM | POA: Diagnosis not present

## 2023-06-12 DIAGNOSIS — M4726 Other spondylosis with radiculopathy, lumbar region: Secondary | ICD-10-CM | POA: Diagnosis not present

## 2023-06-21 DIAGNOSIS — I10 Essential (primary) hypertension: Secondary | ICD-10-CM | POA: Diagnosis not present

## 2023-06-21 DIAGNOSIS — G4733 Obstructive sleep apnea (adult) (pediatric): Secondary | ICD-10-CM | POA: Diagnosis not present

## 2023-06-23 DIAGNOSIS — M4726 Other spondylosis with radiculopathy, lumbar region: Secondary | ICD-10-CM | POA: Diagnosis not present

## 2023-06-25 DIAGNOSIS — M4726 Other spondylosis with radiculopathy, lumbar region: Secondary | ICD-10-CM | POA: Diagnosis not present

## 2023-07-22 DIAGNOSIS — G4733 Obstructive sleep apnea (adult) (pediatric): Secondary | ICD-10-CM | POA: Diagnosis not present

## 2023-07-22 DIAGNOSIS — I10 Essential (primary) hypertension: Secondary | ICD-10-CM | POA: Diagnosis not present

## 2023-08-04 DIAGNOSIS — G4733 Obstructive sleep apnea (adult) (pediatric): Secondary | ICD-10-CM | POA: Diagnosis not present

## 2023-08-04 DIAGNOSIS — E785 Hyperlipidemia, unspecified: Secondary | ICD-10-CM | POA: Diagnosis not present

## 2023-08-04 DIAGNOSIS — I10 Essential (primary) hypertension: Secondary | ICD-10-CM | POA: Diagnosis not present

## 2023-08-07 DIAGNOSIS — R040 Epistaxis: Secondary | ICD-10-CM | POA: Diagnosis not present

## 2023-08-11 DIAGNOSIS — I773 Arterial fibromuscular dysplasia: Secondary | ICD-10-CM | POA: Diagnosis not present

## 2023-08-11 DIAGNOSIS — R040 Epistaxis: Secondary | ICD-10-CM | POA: Diagnosis not present

## 2023-08-13 ENCOUNTER — Other Ambulatory Visit: Payer: Self-pay | Admitting: Otolaryngology

## 2023-08-17 ENCOUNTER — Encounter (HOSPITAL_BASED_OUTPATIENT_CLINIC_OR_DEPARTMENT_OTHER): Payer: Self-pay | Admitting: Otolaryngology

## 2023-08-17 ENCOUNTER — Other Ambulatory Visit: Payer: Self-pay

## 2023-08-17 NOTE — Progress Notes (Signed)
   08/17/23 1503  PAT Phone Screen  Is the patient taking a GLP-1 receptor agonist? No  Do You Have Diabetes? No  Do You Have Hypertension? Yes  Have You Ever Been to the ER for Asthma? No  Have You Taken Oral Steroids in the Past 3 Months? No  Do you Take Phenteramine or any Other Diet Drugs? No  Recent  Lab Work, EKG, CXR? No  Do you have a history of heart problems? (S)  No (fibromuscular dysplasia of carotid artery)  Any Recent Hospitalizations? No  Height 5\' 5"  (1.651 m)  Weight 72.6 kg  Pat Appointment Scheduled (S)  Yes (EKG, BMP)

## 2023-08-19 DIAGNOSIS — I10 Essential (primary) hypertension: Secondary | ICD-10-CM | POA: Diagnosis not present

## 2023-08-19 DIAGNOSIS — G4733 Obstructive sleep apnea (adult) (pediatric): Secondary | ICD-10-CM | POA: Diagnosis not present

## 2023-08-21 ENCOUNTER — Encounter (HOSPITAL_BASED_OUTPATIENT_CLINIC_OR_DEPARTMENT_OTHER)
Admission: RE | Admit: 2023-08-21 | Discharge: 2023-08-21 | Disposition: A | Discharge status: Home / Self Care | Source: Ambulatory Visit | Attending: Otolaryngology | Admitting: Otolaryngology

## 2023-08-21 ENCOUNTER — Other Ambulatory Visit: Payer: Self-pay

## 2023-08-21 DIAGNOSIS — Z01810 Encounter for preprocedural cardiovascular examination: Secondary | ICD-10-CM | POA: Diagnosis not present

## 2023-08-21 DIAGNOSIS — R040 Epistaxis: Secondary | ICD-10-CM | POA: Diagnosis not present

## 2023-08-21 DIAGNOSIS — K219 Gastro-esophageal reflux disease without esophagitis: Secondary | ICD-10-CM | POA: Diagnosis not present

## 2023-08-21 DIAGNOSIS — I10 Essential (primary) hypertension: Secondary | ICD-10-CM | POA: Diagnosis not present

## 2023-08-21 DIAGNOSIS — G4730 Sleep apnea, unspecified: Secondary | ICD-10-CM | POA: Diagnosis not present

## 2023-08-21 LAB — BASIC METABOLIC PANEL
Anion gap: 9 (ref 5–15)
BUN: 16 mg/dL (ref 8–23)
CO2: 29 mmol/L (ref 22–32)
Calcium: 9.4 mg/dL (ref 8.9–10.3)
Chloride: 102 mmol/L (ref 98–111)
Creatinine, Ser: 0.84 mg/dL (ref 0.44–1.00)
GFR, Estimated: >60 mL/min (ref >60)
Glucose, Bld: 74 mg/dL (ref 70–99)
Potassium: 3.9 mmol/L (ref 3.5–5.1)
Sodium: 140 mmol/L (ref 135–145)

## 2023-08-21 NOTE — Progress Notes (Signed)
 Patient reminded via text to come in to get labs and EKG completed prior to surgery scheduled for 08/24/23 with Dr. Ernestene Kiel at [facility].

## 2023-08-21 NOTE — Progress Notes (Signed)
 Surgical soap and presurgical drink given to patient, instructions given, patient verbalized understanding. Labs drawn. EKG completed.

## 2023-08-24 ENCOUNTER — Encounter (HOSPITAL_BASED_OUTPATIENT_CLINIC_OR_DEPARTMENT_OTHER)
Admission: RE | Disposition: A | Discharge status: Home / Self Care | Payer: Self-pay | Source: Home / Self Care | Attending: Otolaryngology

## 2023-08-24 ENCOUNTER — Ambulatory Visit (HOSPITAL_BASED_OUTPATIENT_CLINIC_OR_DEPARTMENT_OTHER): Payer: Self-pay | Admitting: Anesthesiology

## 2023-08-24 ENCOUNTER — Other Ambulatory Visit: Payer: Self-pay

## 2023-08-24 ENCOUNTER — Ambulatory Visit (HOSPITAL_BASED_OUTPATIENT_CLINIC_OR_DEPARTMENT_OTHER)
Admission: RE | Admit: 2023-08-24 | Discharge: 2023-08-24 | Disposition: A | Discharge status: Home / Self Care | Payer: PPO | Attending: Otolaryngology | Admitting: Otolaryngology

## 2023-08-24 ENCOUNTER — Encounter (HOSPITAL_BASED_OUTPATIENT_CLINIC_OR_DEPARTMENT_OTHER): Payer: Self-pay | Admitting: Otolaryngology

## 2023-08-24 DIAGNOSIS — R040 Epistaxis: Secondary | ICD-10-CM | POA: Diagnosis not present

## 2023-08-24 DIAGNOSIS — K219 Gastro-esophageal reflux disease without esophagitis: Secondary | ICD-10-CM | POA: Insufficient documentation

## 2023-08-24 DIAGNOSIS — G4730 Sleep apnea, unspecified: Secondary | ICD-10-CM | POA: Insufficient documentation

## 2023-08-24 DIAGNOSIS — I10 Essential (primary) hypertension: Secondary | ICD-10-CM | POA: Insufficient documentation

## 2023-08-24 HISTORY — DX: Sleep apnea, unspecified: G47.30

## 2023-08-24 HISTORY — PX: NASAL HEMORRHAGE CONTROL: SHX287

## 2023-08-24 SURGERY — CONTROL OF EPISTAXIS
Anesthesia: General | Site: Nose | Laterality: Right

## 2023-08-24 MED ORDER — SODIUM BICARBONATE 4.2 % IV SOLN
INTRAVENOUS | Status: AC
  Filled 2023-08-24: qty 10

## 2023-08-24 MED ORDER — SODIUM CHLORIDE 0.9% FLUSH
3.0000 mL | Freq: Two times a day (BID) | INTRAVENOUS | Status: DC
Start: 2023-08-24 — End: 2023-08-24

## 2023-08-24 MED ORDER — FENTANYL CITRATE (PF) 100 MCG/2ML IJ SOLN: 25.0000 ug | INTRAMUSCULAR | Status: DC | PRN

## 2023-08-24 MED ORDER — BACITRACIN ZINC 500 UNIT/GM EX OINT
TOPICAL_OINTMENT | CUTANEOUS | Status: DC | PRN
  Administered 2023-08-24: 1 via TOPICAL

## 2023-08-24 MED ORDER — LIDOCAINE 2% (20 MG/ML) 5 ML SYRINGE
INTRAMUSCULAR | Status: AC
  Filled 2023-08-24: qty 5

## 2023-08-24 MED ORDER — SODIUM BICARBONATE 4.2 % IV SOLN
INTRAVENOUS | Status: DC | PRN
  Administered 2023-08-24: 1 mL via INTRAVENOUS

## 2023-08-24 MED ORDER — PROPOFOL 10 MG/ML IV BOLUS
INTRAVENOUS | Status: AC
  Filled 2023-08-24: qty 20

## 2023-08-24 MED ORDER — ONDANSETRON HCL 4 MG/2ML IJ SOLN: 4.0000 mg | Freq: Once | INTRAMUSCULAR | Status: DC | PRN

## 2023-08-24 MED ORDER — SODIUM CHLORIDE 0.9% FLUSH: 3.0000 mL | INTRAVENOUS | Status: DC | PRN

## 2023-08-24 MED ORDER — OXYCODONE HCL 5 MG/5ML PO SOLN: 5.0000 mg | Freq: Once | ORAL | Status: DC | PRN

## 2023-08-24 MED ORDER — ACETAMINOPHEN 160 MG/5ML PO SOLN: 325.0000 mg | ORAL | Status: DC | PRN

## 2023-08-24 MED ORDER — FENTANYL CITRATE (PF) 100 MCG/2ML IJ SOLN
INTRAMUSCULAR | Status: AC
  Filled 2023-08-24: qty 2

## 2023-08-24 MED ORDER — EPINEPHRINE HCL (NASAL) 0.1 % NA SOLN
NASAL | Status: DC | PRN
  Administered 2023-08-24: 10 mL via TOPICAL

## 2023-08-24 MED ORDER — OXYCODONE HCL 5 MG PO TABS: 5.0000 mg | ORAL_TABLET | Freq: Once | ORAL | Status: DC | PRN

## 2023-08-24 MED ORDER — MIDAZOLAM HCL 2 MG/2ML IJ SOLN
INTRAMUSCULAR | Status: AC
  Filled 2023-08-24: qty 2

## 2023-08-24 MED ORDER — MEPERIDINE HCL 25 MG/ML IJ SOLN: 6.2500 mg | INTRAMUSCULAR | Status: DC | PRN

## 2023-08-24 MED ORDER — ACETAMINOPHEN 325 MG PO TABS: 325.0000 mg | ORAL_TABLET | ORAL | Status: DC | PRN

## 2023-08-24 MED ORDER — LIDOCAINE-EPINEPHRINE 1 %-1:100000 IJ SOLN
INTRAMUSCULAR | Status: DC | PRN
  Administered 2023-08-24: 9 mL

## 2023-08-24 SURGICAL SUPPLY — 38 items
APPLICATOR DR MATTHEWS STRL (MISCELLANEOUS) IMPLANT
CANISTER SUCT 1200ML W/VALVE (MISCELLANEOUS) ×1 IMPLANT
COAGULATOR SUCT 8FR VV (MISCELLANEOUS) IMPLANT
COAGULATOR SUCT SWTCH 10FR 6 (ELECTROSURGICAL) IMPLANT
CORD BIPOLAR FORCEPS 12FT (ELECTRODE) ×1 IMPLANT
COVER BACK TABLE 60X90IN (DRAPES) ×1 IMPLANT
COVER MAYO STAND STRL (DRAPES) ×1 IMPLANT
DRSG NASOPORE 8CM (GAUZE/BANDAGES/DRESSINGS) IMPLANT
DRSG TELFA 3X8 NADH STRL (GAUZE/BANDAGES/DRESSINGS) IMPLANT
ELECT REM PT RETURN 9FT ADLT (ELECTROSURGICAL) IMPLANT
ELECTRODE REM PT RTRN 9FT ADLT (ELECTROSURGICAL) IMPLANT
FORCEPS BIPOLAR SPETZLER 8 1.0 (NEUROSURGERY SUPPLIES) ×1 IMPLANT
GAUZE SPONGE 2X2 STRL 8-PLY (GAUZE/BANDAGES/DRESSINGS) IMPLANT
GLOVE BIO SURGEON STRL SZ7.5 (GLOVE) ×1 IMPLANT
GLOVE BIOGEL PI IND STRL 8 (GLOVE) IMPLANT
GOWN STRL REUS W/ TWL LRG LVL3 (GOWN DISPOSABLE) IMPLANT
GOWN STRL REUS W/ TWL XL LVL3 (GOWN DISPOSABLE) IMPLANT
NDL HYPO 30GX1 BEV (NEEDLE) IMPLANT
NDL PRECISIONGLIDE 27X1.5 (NEEDLE) IMPLANT
NEEDLE HYPO 30GX1 BEV (NEEDLE) ×1 IMPLANT
NEEDLE PRECISIONGLIDE 27X1.5 (NEEDLE) IMPLANT
NS IRRIG 1000ML POUR BTL (IV SOLUTION) IMPLANT
PACK BASIN DAY SURGERY FS (CUSTOM PROCEDURE TRAY) ×1 IMPLANT
PATTIES SURGICAL .5 X3 (DISPOSABLE) ×1 IMPLANT
SHEET MEDIUM DRAPE 40X70 STRL (DRAPES) ×1 IMPLANT
SLEEVE SCD COMPRESS KNEE MED (STOCKING) IMPLANT
SPIKE FLUID TRANSFER (MISCELLANEOUS) IMPLANT
SPLINT NASAL POSISEP X .6X2 (GAUZE/BANDAGES/DRESSINGS) IMPLANT
SPONGE NEURO XRAY DETECT 1X3 (DISPOSABLE) IMPLANT
SPONGE SURGIFOAM ABS GEL 12-7 (HEMOSTASIS) IMPLANT
SUCTION TUBE FRAZIER 10FR DISP (SUCTIONS) IMPLANT
SYR 10ML LL (SYRINGE) IMPLANT
SYR CONTROL 10ML LL (SYRINGE) IMPLANT
TOWEL GREEN STERILE FF (TOWEL DISPOSABLE) ×1 IMPLANT
TUBE CONNECTING 20X1/4 (TUBING) ×1 IMPLANT
TUBE SALEM SUMP 12FR 48 (TUBING) IMPLANT
TUBE SALEM SUMP 16F (TUBING) IMPLANT
YANKAUER SUCT BULB TIP NO VENT (SUCTIONS) ×1 IMPLANT

## 2023-08-24 NOTE — H&P (Signed)
 Judith Mcdonald is an 68 y.o. female.    Chief Complaint: nosebleeds  HPI: Patient presents today for planned elective procedure.  He/she denies any interval change in history since office visit on 08/11/23.   Past Medical History:  Diagnosis Date   Arthritis    Carotid stenosis    Complication of anesthesia    Patient has fibromuscular dyplasia of carotid arteries,"neck positioning is important"   Constipation    Fibromuscular dysplasia (HCC)    GERD (gastroesophageal reflux disease)    H/O typhus    at age 89 yrs   History of recurrent UTIs    Hx of amenorrhea    Hx of menorrhagia    Hx of migraines    with menses   Hx: UTI (urinary tract infection)    Hypercholesteremia    Hypertension    Dr. 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 York Spaniel office   CESAREAN SECTION     2x in 1984 & 1986   HARDWARE REMOVAL  05/31/2012   Procedure: HARDWARE REMOVAL;  Surgeon: Mariam Dollar, MD;  Location: [facility];  Service: Neurosurgery;  Laterality: N/A;  Exploration of Lumbar Fusion Lumbar Four to Sacral One    NASAL SEPTUM SURGERY      Family History  Problem Relation Age of Onset   Hyperlipidemia Mother    Dementia Mother    Stroke Father    Hypertension Father    Cancer Father    Heart disease Father    Hyperlipidemia Father    Parkinson's disease Father    Hyperlipidemia Brother    Hypertension Brother    Schizophrenia Brother    COPD Brother    Heart disease Maternal Grandmother    Cancer Paternal Grandfather    Sleep apnea Neg Hx     Social History:  reports that she has never  smoked. She has never used smokeless tobacco. She reports current alcohol use. She reports that she does not use drugs.  Allergies:  Allergies  Allergen Reactions   Hydromorphone Hcl Other (See Comments)    Lip tingling, throat "itching"    Medications Prior to Admission  Medication Sig Dispense Refill   aspirin EC 81 MG tablet Take 81 mg by mouth daily.     cholecalciferol (VITAMIN D) 1000 UNITS tablet Take 1,000 Units by mouth daily. 2,000 units daily     fluticasone (FLONASE) 50 MCG/ACT nasal spray Place 1-2 sprays into the nose daily.     hydrochlorothiazide (HYDRODIURIL) 25 MG tablet Take 25 mg by mouth daily.     levothyroxine (SYNTHROID, LEVOTHROID) 75 MCG tablet Take 75 mcg by mouth daily.     rosuvastatin (CRESTOR) 10 MG tablet Take 5 mg by mouth daily.      VITAMIN D PO Take 300 mg by mouth.     Calcium Carbonate-Vitamin D (CALCIUM PLUS VITAMIN D PO) Take 1 tablet by mouth daily. (Patient not taking: Reported on 10/27/2022)     cyclobenzaprine (  FLEXERIL) 10 MG tablet Take 1 tablet (10 mg total) by mouth 3 (three) times daily as needed for muscle spasms. (Patient not taking: Reported on 10/27/2022) 80 tablet 1   esomeprazole (NEXIUM) 40 MG capsule Take 40 mg by mouth daily before breakfast. (Patient not taking: Reported on 10/27/2022)     Estradiol (VAGIFEM) 10 MCG TABS Place 1 tablet vaginally every 30 (thirty) days. Every 3 weeks (Patient not taking: Reported on 10/27/2022)     gabapentin (NEURONTIN) 300 MG capsule Take 300 mg by mouth at bedtime. (Patient not taking: Reported on 10/27/2022)     lisinopril (PRINIVIL,ZESTRIL) 10 MG tablet Take 2.5 mg by mouth every other day. Takes 1/4 tab daily (Patient not taking: Reported on 10/27/2022)     nitrofurantoin (MACRODANTIN) 50 MG capsule Take 1 capsule (50 mg total) by mouth as needed. (Patient not taking: Reported on 10/27/2022) 1 capsule 12   oxymetazoline (AFRIN NASAL SPRAY) 0.05 % nasal spray Place 1 spray into both nostrils 2 (two)  times daily. (Patient not taking: Reported on 10/27/2022) 30 mL 0   senna-docusate (SENOKOT-S) 8.6-50 MG per tablet Take 1 tablet by mouth daily. (Patient not taking: Reported on 10/27/2022)      No results found for this or any previous visit (from the past 48 hours). No results found.  ROS: negative other than stated in HPI  Blood pressure (!) 142/85, pulse 66, temperature 97.9 F (36.6 C), temperature source Temporal, resp. rate 16, height 5\' 5"  (1.651 m), weight 71.8 kg, SpO2 99%.  PHYSICAL EXAM: General: Resting comfortably in NAD  Lungs: Non-labored respiratinos  Studies Reviewed: None   Assessment/Plan Recalcitrant anterior epistaxis  Proceed with nasal cautery under anesthesia. Informed consent obtained. RBA discussed.     Electronically signed by:  Scarlette Ar, MD  Staff Physician Facial Plastic & Reconstructive Surgery Otolaryngology - Head and Neck Surgery [facility]  08/24/2023, 11:23 AM

## 2023-08-24 NOTE — Op Note (Signed)
 OPERATIVE NOTE  STELLAR [facility]ENSEL Date/Time of Admission: 08/24/2023 10:27 AM  CSN: [id number];EXB:[id number] Attending Provider: Scarlette Ar, MD Room/Bed: [facility]/NONE DOB: 1955-07-03 Age: 68 y.o.   Pre-Op Diagnosis: Right Nasal Epistaxis  Post-Op Diagnosis: Right Nasal Epistaxis  Procedure: Bipolar cauterization nasal epistaxis - RIGHT side  Anesthesia: LOCAL  Surgeon(s): Mervin Kung, MD  Staff: Circulator: Lenn Cal, RN Scrub Person: Mariel Craft K  Implants: * No implants in log *  Specimens: * No specimens in log *  Complications: none  EBL: none ML  IVF: none ML  Condition: stable  Operative Findings:  Vascular telangiectasia ablated with bipolar cautery  Description of Operation:  The patient was identified in the preoperative area and consent confirmed in the chart.  She was brought to the operating room by the circulating nurse and placed on the operating table.  A preoperative timeout was performed.  The patient's nose was decongested with 1 1000 adrenaline pledgets.  A nasal speculum was used to visualize the right anterior septum where the vascular lesion was present.  Local anesthetic consisting of 1% lidocaine 1 100,000 epinephrine mixed with sodium bicarbonate was infiltrated in the septum for local anesthesia.  Next judicious use of bipolar cautery on a setting of 30 was used to ablate the lesion entirely.  Hemostasis was confirmed.  The area was dressed with topical bacitracin.  The patient was then brought to the recovery room in stable condition.   Mervin Kung, MD [facility]  08/24/2023

## 2023-08-24 NOTE — Discharge Instructions (Signed)
 Discharge instructions after nasal cautery:  Use aquaphor or vaseline morning and night for lubrication to your nostrils Liberal use of OTC saline sprays OK to resume normal activity and regular diet  Scarlette Ar MD [facility] [place] [phone] [address] [place], [place] [address] Phone: [phone]

## 2023-08-24 NOTE — Anesthesia Preprocedure Evaluation (Addendum)
 Anesthesia Evaluation  Patient identified by MRN, date of birth, ID band Patient awake    Reviewed: Allergy & Precautions, H&P , NPO status , Patient's Chart, lab work & pertinent test results  Airway Mallampati: II  TM Distance: >3 FB Neck ROM: Full    Dental no notable dental hx. (+) Poor Dentition, Chipped, Dental Advisory Given   Pulmonary neg pulmonary ROS, sleep apnea and Continuous Positive Airway Pressure Ventilation    Pulmonary exam normal breath sounds clear to auscultation       Cardiovascular Exercise Tolerance: Good hypertension, Pt. on medications negative cardio ROS Normal cardiovascular exam Rhythm:Regular Rate:Normal     Neuro/Psych negative neurological ROS  negative psych ROS   GI/Hepatic negative GI ROS, Neg liver ROS,GERD  ,,  Endo/Other  negative endocrine ROSHypothyroidism    Renal/GU negative Renal ROS  negative genitourinary   Musculoskeletal negative musculoskeletal ROS (+) Arthritis ,    Abdominal   Peds negative pediatric ROS (+)  Hematology negative hematology ROS (+)   Anesthesia Other Findings   Reproductive/Obstetrics negative OB ROS                             Anesthesia Physical Anesthesia Plan  ASA: 3  Anesthesia Plan: General   Post-op Pain Management: Minimal or no pain anticipated   Induction: Intravenous  PONV Risk Score and Plan: 3 and Dexamethasone and Treatment may vary due to age or medical condition  Airway Management Planned: Oral ETT  Additional Equipment: None  Intra-op Plan:   Post-operative Plan: Extubation in OR  Informed Consent: I have reviewed the patients History and Physical, chart, labs and discussed the procedure including the risks, benefits and alternatives for the proposed anesthesia with the patient or authorized representative who has indicated his/her understanding and acceptance.       Plan Discussed  with: Anesthesiologist and CRNA  Anesthesia Plan Comments: (  )       Anesthesia Quick Evaluation

## 2023-08-25 ENCOUNTER — Encounter (HOSPITAL_BASED_OUTPATIENT_CLINIC_OR_DEPARTMENT_OTHER): Payer: Self-pay | Admitting: Otolaryngology

## 2023-09-02 DIAGNOSIS — R040 Epistaxis: Secondary | ICD-10-CM | POA: Diagnosis not present

## 2023-09-02 DIAGNOSIS — Z9889 Other specified postprocedural states: Secondary | ICD-10-CM | POA: Diagnosis not present

## 2023-09-09 DIAGNOSIS — I10 Essential (primary) hypertension: Secondary | ICD-10-CM | POA: Diagnosis not present

## 2023-09-09 DIAGNOSIS — G4733 Obstructive sleep apnea (adult) (pediatric): Secondary | ICD-10-CM | POA: Diagnosis not present

## 2023-09-16 DIAGNOSIS — Z23 Encounter for immunization: Secondary | ICD-10-CM | POA: Diagnosis not present

## 2023-09-16 DIAGNOSIS — Z7184 Encounter for health counseling related to travel: Secondary | ICD-10-CM | POA: Diagnosis not present

## 2023-10-22 NOTE — Progress Notes (Signed)
 PATIENT: Judith Mcdonald DOB: 04-Oct-1955  REASON FOR VISIT: follow up HISTORY FROM: patient  Chief Complaint  Patient presents with   Follow-up    Pt in 1 alone Pt here for cpap f/u Pt has questions about cleaning cpap equipment and supplies to prevent mold . Made patient aware to call DME company for cleaning tips and what supplies to use to clean pap machine      HISTORY OF PRESENT ILLNESS:  10/27/23 ALL:  Judith Mcdonald is a 68 y.o. female here today for follow up for OSA on CPAP. She continues to do well. She is using therapy most every night. She has traveled, recently, and did not take her CPAP. She does feel that she rests better. She denies concerns with machine or supplies.     10/27/2022 ALL:  Judith Mcdonald is a 68 y.o. female here today for follow up for OSA on CPAP.  She was seen in consult with Dr Omar Bibber 04/2022 for concerns of snoring, dry mouth and nocturia. PSG 07/2022 showed mild OSA with total AHI 5.3/h, REM AHI 19/h and 02 nadir 85%. AutoPAP preferred by patient. She reports doing well. She rarely snores. She is sleeping better. Doesn't wake as much during the night. Wakes refreshed. She is tolerating it well. She is using a nasal mask and would like to try a nasal pillow. Otherwise, doing great.     HISTORY: (copied from Dr Dail Drought previous note)  Dear Dr. Bernetta Brilliant,   I saw your patient, Judith Mcdonald, upon your kind request in my sleep clinic today for initial consultation of her sleep disorder, in particular concern for underlying obstructive sleep apnea.  The patient is unaccompanied today.  As you know, Ms. 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 I reviewed your office note from 03/12/2022.  Her Epworth sleepiness score is 1 out of 24, fatigue severity score is 33 out of 63.  She has occasional difficulty falling asleep and takes melatonin occasionally.  Bedtime is generally between 10 and 11 PM and rise time around 7 AM.  She has woken up from her own snoring and difficulty exhaling at night.  She lives with her husband, they have 3 grown children.  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  She has severe dry mouth often when she will first wakes up.  She has nocturia about twice per average night and denies recurrent nocturnal or morning headaches.  She drinks caffeine in the form of tea, about 3 cups/day, into the afternoon for the last serving.  She drinks alcohol  occasionally, she is a non-smoker.  She had tonsillectomy as a child that septoplasty several years ago and she had nasal cauterization for epistaxis.  She tried Breathe Right strips which did not help.  She does not have a TV in her bedroom.  They have no pets in the household.   REVIEW OF SYSTEMS: Out of a complete 14 system review of symptoms, the patient complains only of the following symptoms, none and all other reviewed systems are negative.  ESS: 3/24  ALLERGIES: Allergies  Allergen Reactions   Hydromorphone  Hcl Other (See Comments)    Lip tingling, throat "itching"  HOME MEDICATIONS: Outpatient Medications Prior to Visit  Medication Sig Dispense Refill   aspirin  EC 81 MG tablet Take 81 mg by mouth daily.     chlorthalidone (HYGROTON) 25 MG tablet 25 mg.     fluticasone  (FLONASE ) 50 MCG/ACT nasal spray Place 1-2 sprays into the nose daily.     levothyroxine  (SYNTHROID , LEVOTHROID) 75 MCG tablet Take 75 mcg by mouth daily.     rosuvastatin (CRESTOR) 10 MG tablet Take 5 mg by mouth daily.      VITAMIN D PO Take by mouth.     esomeprazole (NEXIUM) 40 MG capsule Take 40 mg by  mouth daily before breakfast. (Patient not taking: Reported on 10/27/2022)     hydrochlorothiazide  (HYDRODIURIL ) 25 MG tablet Take 25 mg by mouth daily.     No facility-administered medications prior to visit.    PAST MEDICAL HISTORY: Past Medical History:  Diagnosis Date   Arthritis    Carotid stenosis    Complication of anesthesia    Patient has fibromuscular dyplasia of carotid arteries,"neck positioning is important"   Constipation    Fibromuscular dysplasia (HCC)    GERD (gastroesophageal reflux disease)    H/O typhus    at age 48 yrs   History of recurrent UTIs    Hx of amenorrhea    Hx of menorrhagia    Hx of migraines    with menses   Hx: UTI (urinary tract infection)    Hypercholesteremia    Hypertension    Dr. 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 Lindajean Res office   CESAREAN SECTION     2x in 1984 & 1986   HARDWARE REMOVAL  05/31/2012   Procedure: HARDWARE REMOVAL;  Surgeon: Ferris Hua, MD;  Location: [facility];  Service: Neurosurgery;  Laterality: N/A;  Exploration of Lumbar Fusion Lumbar Four to Sacral One    NASAL HEMORRHAGE CONTROL Right 08/24/2023   Procedure: MINOR RIGHT NASAL CAUTERY EPISTAXIS CONTROL;  Surgeon: Rush Coupe, MD;  Location: [facility];  Service: ENT;  Laterality: Right;   NASAL SEPTUM SURGERY      FAMILY HISTORY: Family History  Problem Relation Age of Onset   Hyperlipidemia Mother    Dementia Mother    Stroke Father    Hypertension Father    Cancer Father    Heart disease Father    Hyperlipidemia Father     Parkinson's disease Father    Hyperlipidemia Brother    Hypertension Brother    Schizophrenia Brother    COPD Brother    Heart disease Maternal Grandmother    Cancer Paternal Grandfather    Sleep apnea Neg Hx     SOCIAL HISTORY: Social History   Socioeconomic History   Marital status: Married    Spouse name: Not on file   Number of children: Not on file   Years of education: Not on file   Highest education level: Not on file  Occupational History  Not on file  Tobacco Use   Smoking status: Never   Smokeless tobacco: Never  Vaping Use   Vaping status: Not on file  Substance and Sexual Activity   Alcohol  use: Yes    Comment: social   Drug use: No   Sexual activity: Yes    Birth control/protection: None  Other Topics Concern   Not on file  Social History Narrative   Pt lives with husband    Retired    Chief Executive Officer Drivers of Corporate investment banker Strain: Not on Ship broker Insecurity: Not on file  Transportation Needs: Not on file  Physical Activity: Not on file  Stress: Not on file  Social Connections: Not on file  Intimate Partner Violence: Not on file     PHYSICAL EXAM  Vitals:   10/27/23 1036  BP: 122/82  Pulse: 66  Weight: 160 lb (72.6 kg)  Height: 5\' 5"  (1.651 m)    Body mass index is 26.63 kg/m.  Generalized: Well developed, in no acute distress  Cardiology: normal rate and rhythm, no murmur noted Respiratory: clear to auscultation bilaterally  Neurological examination  Mentation: Alert oriented to time, place, history taking. Follows all commands speech and language fluent Cranial nerve II-XII: Pupils were equal round reactive to light. Extraocular movements were full, visual field were full  Motor: The motor testing reveals 5 over 5 strength of all 4 extremities. Good symmetric motor tone is noted throughout.  Gait and station: Gait is normal.    DIAGNOSTIC DATA (LABS, IMAGING, TESTING) - I reviewed patient records, labs, notes,  testing and imaging myself where available.      No data to display           Lab Results  Component Value Date   WBC 7.4 05/28/2012   HGB 10.7 (L) 05/28/2012   HCT 32.4 (L) 05/28/2012   MCV 91.3 05/28/2012   PLT 530 (H) 05/28/2012      Component Value Date/Time   NA 140 08/21/2023 1030   K 3.9 08/21/2023 1030   CL 102 08/21/2023 1030   CO2 29 08/21/2023 1030   GLUCOSE 74 08/21/2023 1030   BUN 16 08/21/2023 1030   CREATININE 0.84 08/21/2023 1030   CALCIUM  9.4 08/21/2023 1030   PROT 7.1 09/25/2011 1033   ALBUMIN 3.8 09/25/2011 1033   AST 24 09/25/2011 1033   ALT 27 09/25/2011 1033   ALKPHOS 70 09/25/2011 1033   BILITOT 0.3 09/25/2011 1033   GFRNONAA >60 08/21/2023 1030   GFRAA >90 05/28/2012 1344   No results found for: "CHOL", "HDL", "LDLCALC", "LDLDIRECT", "TRIG", "CHOLHDL" No results found for: "HGBA1C" No results found for: "VITAMINB12" No results found for: "TSH"   ASSESSMENT AND PLAN 68 y.o. year old female  has a past medical history of Arthritis, Carotid stenosis, Complication of anesthesia, Constipation, Fibromuscular dysplasia (HCC), GERD (gastroesophageal reflux disease), H/O typhus, History of recurrent UTIs, amenorrhea, menorrhagia, migraines, UTI (urinary tract infection), Hypercholesteremia, Hypertension, Hypothyroidism, Irregular menses, Mastodynia, Normal cardiac stress test (06/17/2011), Osteopenia, Sleep apnea, Tuberculosis (06/16/1984), and Vitamin D deficiency. here with     ICD-10-CM   1. OSA (obstructive sleep apnea)  G47.33 For home use only DME continuous positive airway pressure (CPAP)       Doralee Gallop is doing well on CPAP therapy. Compliance report reveals excellent compliance. She was encouraged to continue using CPAP nightly and for greater than 4 hours each night. We will update supply orders as indicated. Risks of untreated sleep  apnea review and education materials provided. Healthy lifestyle habits encouraged. She will follow up  in 1 year, sooner if needed. She verbalizes understanding and agreement with this plan.    Orders Placed This Encounter  Procedures   For home use only DME continuous positive airway pressure (CPAP)    Heated Humidity with all supplies as needed    Length of Need:   Lifetime    Patient has OSA or probable OSA:   Yes    Is the patient currently using CPAP in the home:   Yes    Settings:   Other see comments    CPAP supplies needed:   Mask, headgear, cushions, filters, heated tubing and water chamber     No orders of the defined types were placed in this encounter.     Terrilyn Fick, FNP-C 10/27/2023, 12:48 PM [facility] [address], [address] [place], [place] [address] [phone]

## 2023-10-26 NOTE — Progress Notes (Unsigned)
 Judith Mcdonald

## 2023-10-27 ENCOUNTER — Ambulatory Visit: Payer: PPO | Admitting: Family Medicine

## 2023-10-27 ENCOUNTER — Encounter: Payer: Self-pay | Admitting: Family Medicine

## 2023-10-27 VITALS — BP 122/82 | HR 66 | Ht 65.0 in | Wt 160.0 lb

## 2023-10-27 DIAGNOSIS — G4733 Obstructive sleep apnea (adult) (pediatric): Secondary | ICD-10-CM | POA: Diagnosis not present

## 2023-10-27 NOTE — Patient Instructions (Signed)

## 2023-10-27 NOTE — Progress Notes (Signed)
 Community message sent to [facility] that order placed.

## 2023-12-09 DIAGNOSIS — I10 Essential (primary) hypertension: Secondary | ICD-10-CM | POA: Diagnosis not present

## 2023-12-09 DIAGNOSIS — G4733 Obstructive sleep apnea (adult) (pediatric): Secondary | ICD-10-CM | POA: Diagnosis not present

## 2023-12-10 DIAGNOSIS — L300 Nummular dermatitis: Secondary | ICD-10-CM | POA: Diagnosis not present

## 2023-12-23 DIAGNOSIS — G44309 Post-traumatic headache, unspecified, not intractable: Secondary | ICD-10-CM | POA: Diagnosis not present

## 2023-12-23 DIAGNOSIS — I10 Essential (primary) hypertension: Secondary | ICD-10-CM | POA: Diagnosis not present

## 2023-12-23 DIAGNOSIS — M542 Cervicalgia: Secondary | ICD-10-CM | POA: Diagnosis not present

## 2023-12-31 DIAGNOSIS — L578 Other skin changes due to chronic exposure to nonionizing radiation: Secondary | ICD-10-CM | POA: Diagnosis not present

## 2023-12-31 DIAGNOSIS — L821 Other seborrheic keratosis: Secondary | ICD-10-CM | POA: Diagnosis not present

## 2023-12-31 DIAGNOSIS — L814 Other melanin hyperpigmentation: Secondary | ICD-10-CM | POA: Diagnosis not present

## 2023-12-31 DIAGNOSIS — D229 Melanocytic nevi, unspecified: Secondary | ICD-10-CM | POA: Diagnosis not present

## 2024-03-21 DIAGNOSIS — D23111 Other benign neoplasm of skin of right upper eyelid, including canthus: Secondary | ICD-10-CM | POA: Diagnosis not present

## 2024-03-21 DIAGNOSIS — H43813 Vitreous degeneration, bilateral: Secondary | ICD-10-CM | POA: Diagnosis not present

## 2024-03-21 DIAGNOSIS — H2513 Age-related nuclear cataract, bilateral: Secondary | ICD-10-CM | POA: Diagnosis not present

## 2024-04-04 DIAGNOSIS — S93491A Sprain of other ligament of right ankle, initial encounter: Secondary | ICD-10-CM | POA: Diagnosis not present

## 2024-04-04 DIAGNOSIS — M25571 Pain in right ankle and joints of right foot: Secondary | ICD-10-CM | POA: Diagnosis not present

## 2024-04-11 DIAGNOSIS — M4726 Other spondylosis with radiculopathy, lumbar region: Secondary | ICD-10-CM | POA: Diagnosis not present

## 2024-04-14 DIAGNOSIS — M4726 Other spondylosis with radiculopathy, lumbar region: Secondary | ICD-10-CM | POA: Diagnosis not present

## 2024-04-15 DIAGNOSIS — R3121 Asymptomatic microscopic hematuria: Secondary | ICD-10-CM | POA: Diagnosis not present

## 2024-04-19 DIAGNOSIS — M4726 Other spondylosis with radiculopathy, lumbar region: Secondary | ICD-10-CM | POA: Diagnosis not present

## 2024-04-21 DIAGNOSIS — M4726 Other spondylosis with radiculopathy, lumbar region: Secondary | ICD-10-CM | POA: Diagnosis not present

## 2024-04-22 DIAGNOSIS — I10 Essential (primary) hypertension: Secondary | ICD-10-CM | POA: Diagnosis not present

## 2024-04-22 DIAGNOSIS — E785 Hyperlipidemia, unspecified: Secondary | ICD-10-CM | POA: Diagnosis not present

## 2024-04-22 DIAGNOSIS — E039 Hypothyroidism, unspecified: Secondary | ICD-10-CM | POA: Diagnosis not present

## 2024-04-22 DIAGNOSIS — R7301 Impaired fasting glucose: Secondary | ICD-10-CM | POA: Diagnosis not present

## 2024-04-25 DIAGNOSIS — M4726 Other spondylosis with radiculopathy, lumbar region: Secondary | ICD-10-CM | POA: Diagnosis not present

## 2024-04-29 DIAGNOSIS — R3121 Asymptomatic microscopic hematuria: Secondary | ICD-10-CM | POA: Diagnosis not present

## 2024-04-29 DIAGNOSIS — R7301 Impaired fasting glucose: Secondary | ICD-10-CM | POA: Diagnosis not present

## 2024-04-29 DIAGNOSIS — M8580 Other specified disorders of bone density and structure, unspecified site: Secondary | ICD-10-CM | POA: Diagnosis not present

## 2024-04-29 DIAGNOSIS — N281 Cyst of kidney, acquired: Secondary | ICD-10-CM | POA: Diagnosis not present

## 2024-04-29 DIAGNOSIS — M5416 Radiculopathy, lumbar region: Secondary | ICD-10-CM | POA: Diagnosis not present

## 2024-04-29 DIAGNOSIS — Z1331 Encounter for screening for depression: Secondary | ICD-10-CM | POA: Diagnosis not present

## 2024-04-29 DIAGNOSIS — Z1339 Encounter for screening examination for other mental health and behavioral disorders: Secondary | ICD-10-CM | POA: Diagnosis not present

## 2024-04-29 DIAGNOSIS — E039 Hypothyroidism, unspecified: Secondary | ICD-10-CM | POA: Diagnosis not present

## 2024-04-29 DIAGNOSIS — E785 Hyperlipidemia, unspecified: Secondary | ICD-10-CM | POA: Diagnosis not present

## 2024-04-29 DIAGNOSIS — Z0000 Encounter for general adult medical examination without abnormal findings: Secondary | ICD-10-CM | POA: Diagnosis not present

## 2024-04-29 DIAGNOSIS — I773 Arterial fibromuscular dysplasia: Secondary | ICD-10-CM | POA: Diagnosis not present

## 2024-04-29 DIAGNOSIS — I10 Essential (primary) hypertension: Secondary | ICD-10-CM | POA: Diagnosis not present

## 2024-04-29 DIAGNOSIS — E559 Vitamin D deficiency, unspecified: Secondary | ICD-10-CM | POA: Diagnosis not present

## 2024-04-29 DIAGNOSIS — L719 Rosacea, unspecified: Secondary | ICD-10-CM | POA: Diagnosis not present

## 2024-05-04 DIAGNOSIS — H43813 Vitreous degeneration, bilateral: Secondary | ICD-10-CM | POA: Diagnosis not present

## 2024-10-26 ENCOUNTER — Ambulatory Visit: Admitting: Family Medicine

## 2024-10-26 ENCOUNTER — Ambulatory Visit: Admitting: Neurology
